# Patient Record
Sex: Female | Born: 1960 | Race: White | Hispanic: No | Smoking: Former smoker
Health system: Southern US, Community
[De-identification: ages and names within clinical notes are randomized; demographics above are authoritative.]

## PROBLEM LIST (undated history)

## (undated) DIAGNOSIS — Z91030 Bee allergy status: Secondary | ICD-10-CM

## (undated) DIAGNOSIS — M1990 Unspecified osteoarthritis, unspecified site: Secondary | ICD-10-CM

## (undated) DIAGNOSIS — F419 Anxiety disorder, unspecified: Secondary | ICD-10-CM

## (undated) DIAGNOSIS — J302 Other seasonal allergic rhinitis: Secondary | ICD-10-CM

## (undated) HISTORY — PX: JOINT REPLACEMENT: SHX530

## (undated) HISTORY — PX: FRACTURE SURGERY: SHX138

## (undated) HISTORY — PX: ANKLE HARDWARE REMOVAL: SHX1149

## (undated) HISTORY — DX: Anxiety disorder, unspecified: F41.9

## (undated) HISTORY — DX: Unspecified osteoarthritis, unspecified site: M19.90

---

## 1993-02-27 HISTORY — PX: ANKLE FRACTURE SURGERY: SHX122

## 2003-10-15 ENCOUNTER — Encounter: Admission: RE | Admit: 2003-10-15 | Discharge: 2003-10-15 | Payer: Self-pay | Admitting: Occupational Medicine

## 2006-04-28 HISTORY — PX: WRIST FRACTURE SURGERY: SHX121

## 2006-05-05 ENCOUNTER — Emergency Department (HOSPITAL_COMMUNITY): Admission: EM | Admit: 2006-05-05 | Discharge: 2006-05-05 | Payer: Self-pay | Admitting: Emergency Medicine

## 2006-05-05 ENCOUNTER — Observation Stay (HOSPITAL_COMMUNITY): Admission: EM | Admit: 2006-05-05 | Discharge: 2006-05-06 | Payer: Self-pay | Admitting: Orthopedic Surgery

## 2007-11-29 ENCOUNTER — Other Ambulatory Visit: Admission: RE | Admit: 2007-11-29 | Discharge: 2007-11-29 | Payer: Self-pay | Admitting: Family Medicine

## 2007-11-29 LAB — HM PAP SMEAR: HM Pap smear: NEGATIVE

## 2007-11-29 LAB — BASIC METABOLIC PANEL: Glucose: 76

## 2007-11-29 LAB — TSH: TSH: 0.99 (ref 0.41–5.90)

## 2007-12-17 LAB — HM DEXA SCAN

## 2007-12-18 LAB — LIPID PANEL
Cholesterol: 188 (ref 0–200)
HDL: 49 (ref 35–70)
LDL Cholesterol: 138
Triglycerides: 85 (ref 40–160)

## 2010-07-15 NOTE — Op Note (Signed)
NAMESARH, KIRSCHENBAUM          ACCOUNT NO.:  [id number]   MEDICAL RECORD NO.:  [id number]          PATIENT TYPE:  OBV   LOCATION:  5004                         FACILITY:  [facility]   PHYSICIAN:  Harvie Junior, M.D.   DATE OF BIRTH:  Aug 16, 1960   DATE OF PROCEDURE:  05/05/2006  DATE OF DISCHARGE:                               OPERATIVE REPORT   PREOPERATIVE DIAGNOSIS:  Fracture of distal radius with comminution of 3  or more pieces intraarticular.   POSTOPERATIVE DIAGNOSIS:  Fracture of distal radius with comminution of  3 or more pieces intraarticular.   PROCEDURE PERFORMED:  Open reduction and internal fixation of distal  radius fracture with a distal volar radius plate.   SURGEON:  Harvie Junior, M.D.   ASSISTANT:  Lindwood Qua, P.A.   ANESTHESIA:  General.   BRIEF HISTORY:  Ms. Koppel is 50 year old female with a long history  of having fallen at work.  She ultimately suffered a distal radius  fracture with intraarticular comminution.  I evaluated her in the  emergency room and we had a long talk about treatment options including  the possibility of a closed reduction versus open reduction.  I felt  given her young age, dominant side arm and intraarticular nature of the  fracture that open reduction and internal fixation was the most  appropriate course of action and this is ultimately what she elected to  do.  She was brought to the operating room for this procedure.   PROCEDURE:  The patient is brought to the operating room.  After  adequate anesthesia was obtained with general anesthetic, the patient  was placed up on the operating room table.  The right arm was prepped  and draped in the usual sterile fashion.  Following this, a small  incision was made over the distal radius.  Subcutaneous tissue was  dissected down to the level of the flexor carpi radialis tendon.  This  sheath was opened and the tendon was retracted radially and the floor  was exploited.  The muscles were then retracted down to the palmaris  quadratus which was identified and divided along its radial border.  The  fracture line was then identified and then held in an anatomically  reduced position.  There was some significant comminution dorsally but I  did not feel that this needed to be addressed.  Once the fracture was  anatomically reduced, a plate was provisionally fixed.  With the  provisional fixation, the plate was then placed where it needed to be  appropriately.  At that point, the gliding screw was placed and then the  plate was fixed in an anatomic position.  Once this was completed, the  distal pegs were all placed.  We placed 4 in the proximal row and 3 in  the distal row, had excellent fixation of the styloid as well as of the  plate itself.  Fluoro images were used, which showed that there was  anatomic alignment of the fracture as well as excellent fixation and  that we were not in the joint.  The proximal screws were then placed.  The  most distal screw interestingly was placed where there was some  dorsal comminution so we got 2 great screws and then 1 that was really  part of in the union, but it also seemed like it was wedged into that  dorsal piece and it actually got a good bite so we will have to keep an  eye on that and see.  The wound was then copiously irrigated.  Tourniquet was let down just because there was some question about what  was going on proximally.  The radial artery was intact, you could see it  pulsing at that point and the tourniquet was reinflated for closure.  The palmaris quadratus was reapproximated with 3-0 Vicryl interrupted  sutures and the skin was then closed with interrupted Vicryl and then 4-  0 nylon running  sutures.  Sterile compression dressing was applied as well as a volar  plaster.  The patient was taken to recovery, was noted to be in  satisfactory condition.   She will be admitted for overnight observation and  discharged in the  morning.      Harvie Junior, M.D.  Electronically Signed     JLG/MEDQ  D:  05/05/2006  T:  05/06/2006  Job:  [id number]

## 2016-03-01 DIAGNOSIS — L720 Epidermal cyst: Secondary | ICD-10-CM | POA: Diagnosis not present

## 2016-05-05 DIAGNOSIS — L723 Sebaceous cyst: Secondary | ICD-10-CM | POA: Diagnosis not present

## 2016-05-05 DIAGNOSIS — D485 Neoplasm of uncertain behavior of skin: Secondary | ICD-10-CM | POA: Diagnosis not present

## 2016-09-19 DIAGNOSIS — K080 Exfoliation of teeth due to systemic causes: Secondary | ICD-10-CM | POA: Diagnosis not present

## 2017-03-28 DIAGNOSIS — K080 Exfoliation of teeth due to systemic causes: Secondary | ICD-10-CM | POA: Diagnosis not present

## 2017-04-17 DIAGNOSIS — K080 Exfoliation of teeth due to systemic causes: Secondary | ICD-10-CM | POA: Diagnosis not present

## 2017-04-21 DIAGNOSIS — M25561 Pain in right knee: Secondary | ICD-10-CM | POA: Diagnosis not present

## 2017-04-21 DIAGNOSIS — M25512 Pain in left shoulder: Secondary | ICD-10-CM | POA: Diagnosis not present

## 2017-05-21 DIAGNOSIS — G5602 Carpal tunnel syndrome, left upper limb: Secondary | ICD-10-CM | POA: Diagnosis not present

## 2017-05-21 DIAGNOSIS — M1711 Unilateral primary osteoarthritis, right knee: Secondary | ICD-10-CM | POA: Diagnosis not present

## 2017-05-21 DIAGNOSIS — M1712 Unilateral primary osteoarthritis, left knee: Secondary | ICD-10-CM | POA: Diagnosis not present

## 2017-05-21 DIAGNOSIS — K080 Exfoliation of teeth due to systemic causes: Secondary | ICD-10-CM | POA: Diagnosis not present

## 2017-06-06 DIAGNOSIS — M1712 Unilateral primary osteoarthritis, left knee: Secondary | ICD-10-CM | POA: Diagnosis not present

## 2017-06-06 DIAGNOSIS — M1711 Unilateral primary osteoarthritis, right knee: Secondary | ICD-10-CM | POA: Diagnosis not present

## 2017-06-13 DIAGNOSIS — M1711 Unilateral primary osteoarthritis, right knee: Secondary | ICD-10-CM | POA: Diagnosis not present

## 2017-06-13 DIAGNOSIS — M1712 Unilateral primary osteoarthritis, left knee: Secondary | ICD-10-CM | POA: Diagnosis not present

## 2017-06-27 DIAGNOSIS — M1711 Unilateral primary osteoarthritis, right knee: Secondary | ICD-10-CM | POA: Diagnosis not present

## 2017-06-27 DIAGNOSIS — M1712 Unilateral primary osteoarthritis, left knee: Secondary | ICD-10-CM | POA: Diagnosis not present

## 2017-08-15 DIAGNOSIS — M1712 Unilateral primary osteoarthritis, left knee: Secondary | ICD-10-CM | POA: Diagnosis not present

## 2017-08-15 DIAGNOSIS — M1711 Unilateral primary osteoarthritis, right knee: Secondary | ICD-10-CM | POA: Diagnosis not present

## 2017-10-02 DIAGNOSIS — K080 Exfoliation of teeth due to systemic causes: Secondary | ICD-10-CM | POA: Diagnosis not present

## 2017-12-17 DIAGNOSIS — M170 Bilateral primary osteoarthritis of knee: Secondary | ICD-10-CM | POA: Diagnosis not present

## 2017-12-20 ENCOUNTER — Other Ambulatory Visit: Payer: Self-pay | Admitting: Orthopaedic Surgery

## 2017-12-21 ENCOUNTER — Encounter: Payer: Self-pay | Admitting: Family Medicine

## 2017-12-21 ENCOUNTER — Other Ambulatory Visit: Payer: Self-pay

## 2017-12-21 ENCOUNTER — Ambulatory Visit: Payer: Federal, State, Local not specified - PPO | Admitting: Family Medicine

## 2017-12-21 VITALS — BP 111/74 | HR 71 | Temp 98.3°F | Ht 67.0 in | Wt 233.6 lb

## 2017-12-21 DIAGNOSIS — M25562 Pain in left knee: Secondary | ICD-10-CM | POA: Diagnosis not present

## 2017-12-21 DIAGNOSIS — G8929 Other chronic pain: Secondary | ICD-10-CM

## 2017-12-21 DIAGNOSIS — Z01810 Encounter for preprocedural cardiovascular examination: Secondary | ICD-10-CM

## 2017-12-21 DIAGNOSIS — M1712 Unilateral primary osteoarthritis, left knee: Secondary | ICD-10-CM | POA: Diagnosis not present

## 2017-12-21 LAB — POCT CBC
Granulocyte percent: 57 %G (ref 37–80)
HCT, POC: 37.8 % (ref 29–41)
Hemoglobin: 12.6 g/dL (ref 9.5–13.5)
Lymph, poc: 2.5 (ref 0.6–3.4)
MCH, POC: 33.3 pg — AB (ref 27–31.2)
MCHC: 33.3 g/dL (ref 31.8–35.4)
MCV: 100 fL (ref 76–111)
MID (cbc): 0.3 (ref 0–0.9)
MPV: 7.2 fL (ref 0–99.8)
POC Granulocyte: 3.7 (ref 2–6.9)
POC LYMPH PERCENT: 38.1 %L (ref 10–50)
POC MID %: 4.9 %M (ref 0–12)
Platelet Count, POC: 367 10*3/uL (ref 142–424)
RBC: 3.77 M/uL — AB (ref 4.04–5.48)
RDW, POC: 13 %
WBC: 6.5 10*3/uL (ref 4.6–10.2)

## 2017-12-21 NOTE — Patient Instructions (Signed)
° ° ° °  If you have lab work done today you will be contacted with your lab results within the next 2 weeks.  If you have not heard from us then please contact us. The fastest way to get your results is to register for My Chart. ° ° °IF you received an x-ray today, you will receive an invoice from [facility]. Please contact [facility] at [phone] with questions or concerns regarding your invoice.  ° °IF you received labwork today, you will receive an invoice from [facility]. Please contact [facility] at [phone] with questions or concerns regarding your invoice.  ° °Our billing staff will not be able to assist you with questions regarding bills from these companies. ° °You will be contacted with the lab results as soon as they are available. The fastest way to get your results is to activate your My Chart account. Instructions are located on the last page of this paperwork. If you have not heard from us regarding the results in 2 weeks, please contact this office. °  ° ° ° °

## 2017-12-21 NOTE — Progress Notes (Signed)
10/25/20195:34 PM  Briana Lamb 10-Jul-1960, 57 y.o. female [id number]  Chief Complaint  Patient presents with  . Establish Care    having left knee replacement surgery in November if deemed clear from today visit    HPI:   Patient is a 57 y.o. female with past medical history significant for DJD left knee who presents today for preop clearance for Left TKA  Surgery scheduled for Nov 12th 2019 RCRI: Surgery risk: intermediate H/o ischemic heart disease: no H/o of heart failure: no  H/o CVA: no  DM on insulin: no Preoperative creatinine > 73m/dl: labs pending  Walks 2-3 miles a day Used to walk 8-10 miles a day  Denies any problems with anesthesia  Fall Risk  12/21/2017  Falls in the past year? No     Depression screen PHQ 2/9 12/21/2017  Decreased Interest 0  Down, Depressed, Hopeless 0  PHQ - 2 Score 0    Allergies  Allergen Reactions  . Penicillins Swelling    Prior to Admission medications   Not on File    Past Medical History:  Diagnosis Date  . Allergy   . Anxiety   . Arthritis     Past Surgical History:  Procedure Laterality Date  . ANKLE FRACTURE SURGERY    . CESAREAN SECTION    . WRIST SURGERY      Social History   Tobacco Use  . Smoking status: Never Smoker  . Smokeless tobacco: Never Used  Substance Use Topics  . Alcohol use: Never    Frequency: Never    Family History  Problem Relation Age of Onset  . Stroke Maternal Grandfather     Review of Systems  Constitutional: Negative for chills, fever and malaise/fatigue.  Respiratory: Negative for cough and shortness of breath.   Cardiovascular: Negative for chest pain, palpitations and leg swelling.  Gastrointestinal: Negative for abdominal pain, nausea and vomiting.  Genitourinary: Negative for dysuria, frequency, hematuria and urgency.  Musculoskeletal: Positive for joint pain.  Skin: Negative for rash.  Neurological: Negative for dizziness, tingling and focal  weakness.  Endo/Heme/Allergies: Negative for polydipsia.  All other systems reviewed and are negative.    OBJECTIVE:  Blood pressure 111/74, pulse 71, temperature 98.3 F (36.8 C), temperature source Oral, height 5' 7"  (1.702 m), weight 233 lb 9.6 oz (106 kg), SpO2 98 %. Body mass index is 36.59 kg/m.   Physical Exam  Constitutional: She is oriented to person, place, and time. She appears well-developed and well-nourished.  HENT:  Head: Normocephalic and atraumatic.  Right Ear: Hearing, tympanic membrane, external ear and ear canal normal.  Left Ear: Hearing, tympanic membrane, external ear and ear canal normal.  Mouth/Throat: Oropharynx is clear and moist.  Eyes: Pupils are equal, round, and reactive to light. Conjunctivae and EOM are normal.  Neck: Neck supple. No thyromegaly present.  Cardiovascular: Normal rate, regular rhythm, normal heart sounds and intact distal pulses. Exam reveals no gallop and no friction rub.  No murmur heard. Pulmonary/Chest: Effort normal and breath sounds normal. She has no wheezes. She has no rales.  Abdominal: Soft. Bowel sounds are normal. She exhibits no distension and no mass. There is no tenderness.  Musculoskeletal: Normal range of motion. She exhibits no edema.  Lymphadenopathy:    She has no cervical adenopathy.  Neurological: She is alert and oriented to person, place, and time. She has normal reflexes. No cranial nerve deficit. Gait normal.  Skin: Skin is warm and dry.  Psychiatric: She has  a normal mood and affect.  Nursing note and vitals reviewed.  My interpretation of EKG:  NSR, HR 66, no st changes, normal intervals  Results for orders placed or performed in visit on 12/21/17 (from the past 48 hour(s))  CMP14+EGFR     Status: Abnormal   Collection Time: 12/21/17  5:36 PM  Result Value Ref Range   Glucose 97 65 - 99 mg/dL   BUN 15 6 - 24 mg/dL   Creatinine, Ser 0.83 0.57 - 1.00 mg/dL   GFR calc non Af Amer 77 >59 mL/min/1.73    GFR calc Af Amer 89 >59 mL/min/1.73   BUN/Creatinine Ratio 18 9 - 23   Sodium 144 134 - 144 mmol/L   Potassium 4.0 3.5 - 5.2 mmol/L   Chloride 107 (H) 96 - 106 mmol/L   CO2 21 20 - 29 mmol/L   Calcium 9.3 8.7 - 10.2 mg/dL   Total Protein 6.3 6.0 - 8.5 g/dL   Albumin 4.3 3.5 - 5.5 g/dL   Globulin, Total 2.0 1.5 - 4.5 g/dL   Albumin/Globulin Ratio 2.2 1.2 - 2.2   Bilirubin Total 0.3 0.0 - 1.2 mg/dL   Alkaline Phosphatase 80 39 - 117 IU/L   AST 17 0 - 40 IU/L   ALT 10 0 - 32 IU/L  Protime-INR     Status: None   Collection Time: 12/21/17  5:36 PM  Result Value Ref Range   INR 0.9 0.8 - 1.2    Comment: Reference interval is for non-anticoagulated patients. Suggested INR therapeutic range for Vitamin K antagonist therapy:    Standard Dose (moderate intensity                   therapeutic range):       2.0 - 3.0    Higher intensity therapeutic range       2.5 - 3.5    Prothrombin Time 10.0 9.1 - 12.0 sec  POCT CBC     Status: Abnormal   Collection Time: 12/21/17  5:49 PM  Result Value Ref Range   WBC 6.5 4.6 - 10.2 K/uL   Lymph, poc 2.5 0.6 - 3.4   POC LYMPH PERCENT 38.1 10 - 50 %L   MID (cbc) 0.3 0 - 0.9   POC MID % 4.9 0 - 12 %M   POC Granulocyte 3.7 2 - 6.9   Granulocyte percent 57.0 37 - 80 %G   RBC 3.77 (A) 4.04 - 5.48 M/uL   Hemoglobin 12.6 9.5 - 13.5 g/dL   HCT, POC 37.8 29 - 41 %   MCV 100.0 76 - 111 fL   MCH, POC 33.3 (A) 27 - 31.2 pg   MCHC 33.3 31.8 - 35.4 g/dL   RDW, POC 13.0 %   Platelet Count, POC 367 142 - 424 K/uL   MPV 7.2 0 - 99.8 fL    ASSESSMENT and PLAN  1. Pre-operative cardiovascular examination No concerns per exam, labs or ekg. Patient is medically optimized for TKA. Per ACC/AHA no further cardiac testing required. - CMP14+EGFR - EKG 12-Lead - POCT CBC - Protime-INR - Urine Culture  2. Chronic pain of left knee  3. Primary osteoarthritis of left knee    Return in about 3 months (around 03/23/2018) for CPE.    Rutherford Guys,  MD Primary [facility] at [facility] [place], [place] [address] Ph.  [phone] Fax [phone]

## 2017-12-22 ENCOUNTER — Encounter: Payer: Self-pay | Admitting: Family Medicine

## 2017-12-22 LAB — CMP14+EGFR
ALT: 10 IU/L (ref 0–32)
AST: 17 IU/L (ref 0–40)
Albumin/Globulin Ratio: 2.2 (ref 1.2–2.2)
Albumin: 4.3 g/dL (ref 3.5–5.5)
Alkaline Phosphatase: 80 IU/L (ref 39–117)
BUN/Creatinine Ratio: 18 (ref 9–23)
BUN: 15 mg/dL (ref 6–24)
Bilirubin Total: 0.3 mg/dL (ref 0.0–1.2)
CO2: 21 mmol/L (ref 20–29)
Calcium: 9.3 mg/dL (ref 8.7–10.2)
Chloride: 107 mmol/L — ABNORMAL HIGH (ref 96–106)
Creatinine, Ser: 0.83 mg/dL (ref 0.57–1.00)
GFR calc Af Amer: 89 mL/min/{1.73_m2} (ref >59)
GFR calc non Af Amer: 77 mL/min/{1.73_m2} (ref >59)
Globulin, Total: 2 g/dL (ref 1.5–4.5)
Glucose: 97 mg/dL (ref 65–99)
Potassium: 4 mmol/L (ref 3.5–5.2)
Sodium: 144 mmol/L (ref 134–144)
Total Protein: 6.3 g/dL (ref 6.0–8.5)

## 2017-12-22 LAB — PROTIME-INR
INR: 0.9 (ref 0.8–1.2)
Prothrombin Time: 10 s (ref 9.1–12.0)

## 2017-12-22 LAB — URINE CULTURE

## 2017-12-24 ENCOUNTER — Telehealth: Payer: Self-pay

## 2017-12-24 NOTE — Telephone Encounter (Signed)
Doctor's notes faxed for pt knee replacement surgery

## 2017-12-28 ENCOUNTER — Encounter (HOSPITAL_COMMUNITY)
Admission: RE | Admit: 2017-12-28 | Discharge: 2017-12-28 | Disposition: A | Discharge status: Home / Self Care | Payer: Federal, State, Local not specified - PPO | Source: Ambulatory Visit | Attending: Orthopaedic Surgery | Admitting: Orthopaedic Surgery

## 2017-12-28 ENCOUNTER — Other Ambulatory Visit: Payer: Self-pay

## 2017-12-28 ENCOUNTER — Encounter (HOSPITAL_COMMUNITY): Payer: Self-pay

## 2017-12-28 ENCOUNTER — Ambulatory Visit (HOSPITAL_COMMUNITY)
Admission: RE | Admit: 2017-12-28 | Discharge: 2017-12-28 | Disposition: A | Discharge status: Home / Self Care | Payer: Federal, State, Local not specified - PPO | Source: Ambulatory Visit | Attending: Orthopaedic Surgery | Admitting: Orthopaedic Surgery

## 2017-12-28 DIAGNOSIS — Z01818 Encounter for other preprocedural examination: Secondary | ICD-10-CM

## 2017-12-28 LAB — CBC WITH DIFFERENTIAL/PLATELET
ABS IMMATURE GRANULOCYTES: 0.03 10*3/uL (ref 0.00–0.07)
BASOS PCT: 2 %
Basophils Absolute: 0.1 10*3/uL (ref 0.0–0.1)
Eosinophils Absolute: 0.2 10*3/uL (ref 0.0–0.5)
Eosinophils Relative: 3 %
HCT: 39.3 % (ref 36.0–46.0)
Hemoglobin: 12.6 g/dL (ref 12.0–15.0)
IMMATURE GRANULOCYTES: 0 %
Lymphocytes Relative: 39 %
Lymphs Abs: 2.6 10*3/uL (ref 0.7–4.0)
MCH: 32.4 pg (ref 26.0–34.0)
MCHC: 32.1 g/dL (ref 30.0–36.0)
MCV: 101 fL — ABNORMAL HIGH (ref 80.0–100.0)
Monocytes Absolute: 0.5 10*3/uL (ref 0.1–1.0)
Monocytes Relative: 8 %
NEUTROS ABS: 3.3 10*3/uL (ref 1.7–7.7)
NEUTROS PCT: 48 %
PLATELETS: 416 10*3/uL — AB (ref 150–400)
RBC: 3.89 MIL/uL (ref 3.87–5.11)
RDW: 12.6 % (ref 11.5–15.5)
WBC: 6.8 10*3/uL (ref 4.0–10.5)
nRBC: 0.3 % — ABNORMAL HIGH (ref 0.0–0.2)

## 2017-12-28 LAB — URINALYSIS, ROUTINE W REFLEX MICROSCOPIC
BILIRUBIN URINE: NEGATIVE
Glucose, UA: NEGATIVE mg/dL
Hgb urine dipstick: NEGATIVE
Ketones, ur: NEGATIVE mg/dL
Leukocytes, UA: NEGATIVE
NITRITE: NEGATIVE
PH: 6 (ref 5.0–8.0)
Protein, ur: NEGATIVE mg/dL
SPECIFIC GRAVITY, URINE: 1.006 (ref 1.005–1.030)

## 2017-12-28 LAB — TYPE AND SCREEN
ABO/RH(D): O POS
ANTIBODY SCREEN: NEGATIVE

## 2017-12-28 LAB — BASIC METABOLIC PANEL
ANION GAP: 5 (ref 5–15)
BUN: 6 mg/dL (ref 6–20)
CO2: 27 mmol/L (ref 22–32)
Calcium: 9.2 mg/dL (ref 8.9–10.3)
Chloride: 109 mmol/L (ref 98–111)
Creatinine, Ser: 0.78 mg/dL (ref 0.44–1.00)
GFR calc Af Amer: >60 mL/min (ref >60)
Glucose, Bld: 84 mg/dL (ref 70–99)
POTASSIUM: 3.8 mmol/L (ref 3.5–5.1)
SODIUM: 141 mmol/L (ref 135–145)

## 2017-12-28 LAB — SURGICAL PCR SCREEN
MRSA, PCR: NEGATIVE
Staphylococcus aureus: POSITIVE — AB

## 2017-12-28 LAB — APTT: APTT: 28 s (ref 24–36)

## 2017-12-28 LAB — PROTIME-INR
INR: 0.95
Prothrombin Time: 12.6 seconds (ref 11.4–15.2)

## 2017-12-28 LAB — ABO/RH: ABO/RH(D): O POS

## 2017-12-28 NOTE — Progress Notes (Addendum)
PCP: Koren Shiver, MD  Cardiologist:pt denies  EKG: 12/21/17 in [facility]  Stress test: pt denies  ECHO: pt denies  Cardiac Cath: pt denies  Chest x-ray: 12/28/17 in [facility]

## 2017-12-28 NOTE — Pre-Procedure Instructions (Signed)
Kathalene A Dicenzo  12/28/2017      [id number] [address] [place] [address] Phone: [phone] Fax: [phone]    Your procedure is scheduled on January 08, 2018.  Report to [facility] Admitting at 530 AM.  Call this number if you have problems the morning of surgery:  [phone]   Remember:  Do not eat or drink after midnight.    Take these medicines the morning of surgery with A SIP OF WATER -none  7 days prior to surgery (01/01/18) STOP taking any Aspirin (unless otherwise instructed by your surgeon), Aleve, Naproxen, Ibuprofen, Motrin, Advil, Goody's, BC's, all herbal medications, fish oil, and all vitamins    Do not wear jewelry, make-up or nail polish.  Do not wear lotions, powders, or perfumes, or deodorant.  Do not shave 48 hours prior to surgery.    Do not bring valuables to the hospital.  [facility] is not responsible for any belongings or valuables.  Contacts, dentures or bridgework may not be worn into surgery.  Leave your suitcase in the car.  After surgery it may be brought to your room.  For patients admitted to the hospital, discharge time will be determined by your treatment team.  Patients discharged the day of surgery will not be allowed to drive home.    [place]- Preparing For Surgery  Before surgery, you can play an important role. Because skin is not sterile, your skin needs to be as free of germs as possible. You can reduce the number of germs on your skin by washing with CHG (chlorahexidine gluconate) Soap before surgery.  CHG is an antiseptic cleaner which kills germs and bonds with the skin to continue killing germs even after washing.    Oral Hygiene is also important to reduce your risk of infection.  Remember - BRUSH YOUR TEETH THE MORNING OF SURGERY WITH YOUR REGULAR TOOTHPASTE  Please do not use if you have an allergy to CHG or antibacterial soaps. If your skin  becomes reddened/irritated stop using the CHG.  Do not shave (including legs and underarms) for at least 48 hours prior to first CHG shower. It is OK to shave your face.  Please follow these instructions carefully.   1. Shower the NIGHT BEFORE SURGERY and the MORNING OF SURGERY with CHG.   2. If you chose to wash your hair, wash your hair first as usual with your normal shampoo.  3. After you shampoo, rinse your hair and body thoroughly to remove the shampoo.  4. Use CHG as you would any other liquid soap. You can apply CHG directly to the skin and wash gently with a scrungie or a clean washcloth.   5. Apply the CHG Soap to your body ONLY FROM THE NECK DOWN.  Do not use on open wounds or open sores. Avoid contact with your eyes, ears, mouth and genitals (private parts). Wash Face and genitals (private parts)  with your normal soap.  6. Wash thoroughly, paying special attention to the area where your surgery will be performed.  7. Thoroughly rinse your body with warm water from the neck down.  8. DO NOT shower/wash with your normal soap after using and rinsing off the CHG Soap.  9. Pat yourself dry with a CLEAN TOWEL.  10. Wear CLEAN PAJAMAS to bed the night before surgery, wear comfortable clothes the morning of surgery  11. Place CLEAN SHEETS on your bed the  night of your first shower and DO NOT SLEEP WITH PETS.  Day of Surgery: Shower as above Do not apply any deodorants/lotions.  Please wear clean clothes to the hospital/surgery center.   Remember to brush your teeth WITH YOUR REGULAR TOOTHPASTE.  Please read over the following fact sheets that you were given.

## 2017-12-28 NOTE — Progress Notes (Signed)
[facility] [address], [place].

## 2018-01-02 NOTE — H&P (Signed)
TOTAL KNEE ADMISSION H&P  Patient is being admitted for left total knee arthroplasty.  Subjective:  Chief Complaint:left knee pain.  HPI: Briana Lamb, 57 y.o. female, has a history of pain and functional disability in the left knee due to arthritis and has failed non-surgical conservative treatments for greater than 12 weeks to includeNSAID's and/or analgesics, corticosteriod injections, viscosupplementation injections, flexibility and strengthening excercises, use of assistive devices, weight reduction as appropriate and activity modification.  Onset of symptoms was gradual, starting 5 years ago with gradually worsening course since that time. The patient noted no past surgery on the left knee(s).  Patient currently rates pain in the left knee(s) at 10 out of 10 with activity. Patient has night pain, worsening of pain with activity and weight bearing, pain that interferes with activities of daily living, crepitus and joint swelling.  Patient has evidence of subchondral cysts, subchondral sclerosis, periarticular osteophytes and joint space narrowing by imaging studies. There is no active infection.  There are no active problems to display for this patient.  Past Medical History:  Diagnosis Date  . Allergy   . Anxiety   . Arthritis     Past Surgical History:  Procedure Laterality Date  . ANKLE FRACTURE SURGERY    . CESAREAN SECTION    . WRIST SURGERY      No current facility-administered medications for this encounter.    Current Outpatient Medications  Medication Sig Dispense Refill Last Dose  . Ascorbic Acid (VITAMIN C) 1000 MG tablet Take 1,000 mg by mouth daily.     . Aspirin-Salicylamide-Caffeine (BC FAST PAIN RELIEF) 650-195-33.3 MG PACK Take 1 Package by mouth daily as needed (pain).     Marland Kitchen b complex vitamins tablet Take 1 tablet by mouth daily.     . Echinacea 125 MG CAPS Take 1 capsule by mouth daily as needed (colder months).     . Multiple Vitamins-Minerals  (MULTIVITAMIN WITH MINERALS) tablet Take 1 tablet by mouth daily.      Allergies  Allergen Reactions  . Bee Venom Swelling    At site   . Penicillins Swelling  . Penicillins Swelling    Has patient had a PCN reaction causing immediate rash, facial/tongue/throat swelling, SOB or lightheadedness with hypotension: unkn Has patient had a PCN reaction causing severe rash involving mucus membranes or skin necrosis: unkn Has patient had a PCN reaction that required hospitalization: unkn Has patient had a PCN reaction occurring within the last 10 years: unkn If all of the above answers are "NO", then may proceed with Cephalosporin use.     Social History   Tobacco Use  . Smoking status: Never Smoker  . Smokeless tobacco: Never Used  Substance Use Topics  . Alcohol use: Never    Frequency: Never    Family History  Problem Relation Age of Onset  . Stroke Maternal Grandfather      Review of Systems  Musculoskeletal: Positive for joint pain.       Left knee   All other systems reviewed and are negative.   Objective:  Physical Exam  Constitutional: She is oriented to person, place, and time. She appears well-developed and well-nourished.  HENT:  Head: Normocephalic and atraumatic.  Eyes: Pupils are equal, round, and reactive to light.  Neck: Normal range of motion.  Cardiovascular: Normal rate and regular rhythm.  Respiratory: Effort normal.  GI: Soft.  Musculoskeletal:  Left knee range of motion is 0-85.  There is no effusion today.  She has  crepitation and some medial joint line pain.  Hip motion is full and straight leg raise is negative.  Sensation and motor function are intact distally.  She has palpable pulses in her feet and intact sensation and motor function on both sides.   Neurological: She is alert and oriented to person, place, and time.  Skin: Skin is warm and dry.  Psychiatric: She has a normal mood and affect. Her behavior is normal. Judgment and thought content  normal.    Vital signs in last 24 hours:    Labs:   Estimated body mass index is 38.06 kg/m as calculated from the following:   Height as of 12/28/17: 5\' 6"  (1.676 m).   Weight as of 12/28/17: 107 kg.   Imaging Review Plain radiographs demonstrate severe degenerative joint disease of the left knee(s). The overall alignment isneutral. The bone quality appears to be good for age and reported activity level.   Preoperative templating of the joint replacement has been completed, documented, and submitted to the Operating Room personnel in order to optimize intra-operative equipment management.    Patient's anticipated LOS is less than 2 midnights, meeting these requirements: - Younger than 3 - Lives within 1 hour of care - Has a competent adult at home to recover with post-op recover - NO history of  - Chronic pain requiring opiods  - Diabetes  - Coronary Artery Disease  - Heart failure  - Heart attack  - Stroke  - DVT/VTE  - Cardiac arrhythmia  - Respiratory Failure/COPD  - Renal failure  - Anemia  - Advanced Liver disease  Assessment/Plan:  End stage primary arthritis, left knee   The patient history, physical examination, clinical judgment of the provider and imaging studies are consistent with end stage degenerative joint disease of the left knee(s) and total knee arthroplasty is deemed medically necessary. The treatment options including medical management, injection therapy arthroscopy and arthroplasty were discussed at length. The risks and benefits of total knee arthroplasty were presented and reviewed. The risks due to aseptic loosening, infection, stiffness, patella tracking problems, thromboembolic complications and other imponderables were discussed. The patient acknowledged the explanation, agreed to proceed with the plan and consent was signed. Patient is being admitted for inpatient treatment for surgery, pain control, PT, OT, prophylactic antibiotics, VTE  prophylaxis, progressive ambulation and ADL's and discharge planning. The patient is planning to be discharged home with home health services

## 2018-01-07 MED ORDER — BUPIVACAINE LIPOSOME 1.3 % IJ SUSP
20.0000 mL | INTRAMUSCULAR | Status: AC
  Administered 2018-01-08: 20 mL
  Filled 2018-01-07: qty 20

## 2018-01-07 MED ORDER — TRANEXAMIC ACID 1000 MG/10ML IV SOLN
2000.0000 mg | INTRAVENOUS | Status: AC
  Administered 2018-01-08: 2000 mg via TOPICAL
  Filled 2018-01-07: qty 20

## 2018-01-07 MED ORDER — TRANEXAMIC ACID-NACL 1000-0.7 MG/100ML-% IV SOLN
1000.0000 mg | INTRAVENOUS | Status: AC
  Administered 2018-01-08: 1000 mg via INTRAVENOUS
  Filled 2018-01-07: qty 100

## 2018-01-08 ENCOUNTER — Encounter (HOSPITAL_COMMUNITY): Payer: Self-pay

## 2018-01-08 ENCOUNTER — Observation Stay (HOSPITAL_COMMUNITY)
Admission: RE | Admit: 2018-01-08 | Discharge: 2018-01-09 | Disposition: A | Discharge status: Home Health | Payer: Federal, State, Local not specified - PPO | Source: Ambulatory Visit | Attending: Orthopaedic Surgery | Admitting: Orthopaedic Surgery

## 2018-01-08 ENCOUNTER — Ambulatory Visit (HOSPITAL_COMMUNITY): Payer: Federal, State, Local not specified - PPO | Admitting: Certified Registered"

## 2018-01-08 ENCOUNTER — Encounter (HOSPITAL_COMMUNITY)
Admission: RE | Disposition: A | Discharge status: Home Health | Payer: Self-pay | Source: Ambulatory Visit | Attending: Orthopaedic Surgery

## 2018-01-08 ENCOUNTER — Other Ambulatory Visit: Payer: Self-pay

## 2018-01-08 DIAGNOSIS — Z880 Allergy status to penicillin: Secondary | ICD-10-CM | POA: Insufficient documentation

## 2018-01-08 DIAGNOSIS — M1712 Unilateral primary osteoarthritis, left knee: Secondary | ICD-10-CM | POA: Diagnosis not present

## 2018-01-08 DIAGNOSIS — G8918 Other acute postprocedural pain: Secondary | ICD-10-CM | POA: Diagnosis not present

## 2018-01-08 DIAGNOSIS — Z79899 Other long term (current) drug therapy: Secondary | ICD-10-CM | POA: Diagnosis not present

## 2018-01-08 HISTORY — DX: Bee allergy status: Z91.030

## 2018-01-08 HISTORY — PX: TOTAL KNEE ARTHROPLASTY: SHX125

## 2018-01-08 HISTORY — DX: Other seasonal allergic rhinitis: J30.2

## 2018-01-08 SURGERY — ARTHROPLASTY, KNEE, TOTAL
Anesthesia: Spinal | Laterality: Left

## 2018-01-08 MED ORDER — METOCLOPRAMIDE HCL 5 MG PO TABS: 5.0000 mg | ORAL_TABLET | Freq: Three times a day (TID) | ORAL | Status: DC | PRN

## 2018-01-08 MED ORDER — PROMETHAZINE HCL 25 MG/ML IJ SOLN: 6.2500 mg | INTRAMUSCULAR | Status: DC | PRN

## 2018-01-08 MED ORDER — METHOCARBAMOL 1000 MG/10ML IJ SOLN
500.0000 mg | Freq: Four times a day (QID) | INTRAVENOUS | Status: DC | PRN
  Filled 2018-01-08: qty 5

## 2018-01-08 MED ORDER — CEFAZOLIN SODIUM-DEXTROSE 2-4 GM/100ML-% IV SOLN
2.0000 g | Freq: Four times a day (QID) | INTRAVENOUS | Status: AC
  Administered 2018-01-08 (×2): 2 g via INTRAVENOUS
  Filled 2018-01-08 (×2): qty 100

## 2018-01-08 MED ORDER — PHENYLEPHRINE 40 MCG/ML (10ML) SYRINGE FOR IV PUSH (FOR BLOOD PRESSURE SUPPORT)
PREFILLED_SYRINGE | INTRAVENOUS | Status: DC | PRN
  Administered 2018-01-08: 80 ug via INTRAVENOUS

## 2018-01-08 MED ORDER — ROPIVACAINE HCL 5 MG/ML IJ SOLN
INTRAMUSCULAR | Status: DC | PRN
  Administered 2018-01-08: 20 mL via PERINEURAL

## 2018-01-08 MED ORDER — DEXAMETHASONE SODIUM PHOSPHATE 10 MG/ML IJ SOLN
INTRAMUSCULAR | Status: DC | PRN
  Administered 2018-01-08: 10 mg via INTRAVENOUS

## 2018-01-08 MED ORDER — PROPOFOL 10 MG/ML IV BOLUS
INTRAVENOUS | Status: AC
  Filled 2018-01-08: qty 20

## 2018-01-08 MED ORDER — HYDROCODONE-ACETAMINOPHEN 5-325 MG PO TABS
1.0000 | ORAL_TABLET | ORAL | Status: DC | PRN
  Administered 2018-01-09: 2 via ORAL
  Filled 2018-01-08: qty 2

## 2018-01-08 MED ORDER — MEPERIDINE HCL 50 MG/ML IJ SOLN: 6.2500 mg | INTRAMUSCULAR | Status: DC | PRN

## 2018-01-08 MED ORDER — PHENYLEPHRINE 40 MCG/ML (10ML) SYRINGE FOR IV PUSH (FOR BLOOD PRESSURE SUPPORT)
PREFILLED_SYRINGE | INTRAVENOUS | Status: AC
  Filled 2018-01-08: qty 10

## 2018-01-08 MED ORDER — ONDANSETRON HCL 4 MG/2ML IJ SOLN
INTRAMUSCULAR | Status: DC | PRN
  Administered 2018-01-08: 4 mg via INTRAVENOUS

## 2018-01-08 MED ORDER — DEXAMETHASONE SODIUM PHOSPHATE 10 MG/ML IJ SOLN
INTRAMUSCULAR | Status: AC
  Filled 2018-01-08: qty 1

## 2018-01-08 MED ORDER — BISACODYL 5 MG PO TBEC: 5.0000 mg | DELAYED_RELEASE_TABLET | Freq: Every day | ORAL | Status: DC | PRN

## 2018-01-08 MED ORDER — METOCLOPRAMIDE HCL 5 MG/ML IJ SOLN: 5.0000 mg | Freq: Three times a day (TID) | INTRAMUSCULAR | Status: DC | PRN

## 2018-01-08 MED ORDER — PROPOFOL 10 MG/ML IV BOLUS
INTRAVENOUS | Status: DC | PRN
  Administered 2018-01-08: 20 mg via INTRAVENOUS

## 2018-01-08 MED ORDER — KETOROLAC TROMETHAMINE 15 MG/ML IJ SOLN
15.0000 mg | Freq: Four times a day (QID) | INTRAMUSCULAR | Status: AC
  Administered 2018-01-08 – 2018-01-09 (×4): 15 mg via INTRAVENOUS
  Filled 2018-01-08 (×4): qty 1

## 2018-01-08 MED ORDER — FENTANYL CITRATE (PF) 250 MCG/5ML IJ SOLN
INTRAMUSCULAR | Status: AC
  Filled 2018-01-08: qty 5

## 2018-01-08 MED ORDER — PHENOL 1.4 % MT LIQD: 1.0000 | OROMUCOSAL | Status: DC | PRN

## 2018-01-08 MED ORDER — 0.9 % SODIUM CHLORIDE (POUR BTL) OPTIME
TOPICAL | Status: DC | PRN
  Administered 2018-01-08: 1000 mL

## 2018-01-08 MED ORDER — BUPIVACAINE-EPINEPHRINE (PF) 0.25% -1:200000 IJ SOLN
INTRAMUSCULAR | Status: DC | PRN
  Administered 2018-01-08: 30 mL

## 2018-01-08 MED ORDER — HYDROCODONE-ACETAMINOPHEN 7.5-325 MG PO TABS
1.0000 | ORAL_TABLET | ORAL | Status: DC | PRN
  Administered 2018-01-08: 1 via ORAL
  Administered 2018-01-08: 2 via ORAL
  Filled 2018-01-08: qty 2
  Filled 2018-01-08: qty 1

## 2018-01-08 MED ORDER — MIDAZOLAM HCL 2 MG/2ML IJ SOLN
INTRAMUSCULAR | Status: AC
  Filled 2018-01-08: qty 2

## 2018-01-08 MED ORDER — PROPOFOL 500 MG/50ML IV EMUL
INTRAVENOUS | Status: DC | PRN
  Administered 2018-01-08: 75 ug/kg/min via INTRAVENOUS

## 2018-01-08 MED ORDER — DIPHENHYDRAMINE HCL 12.5 MG/5ML PO ELIX: 12.5000 mg | ORAL_SOLUTION | ORAL | Status: DC | PRN

## 2018-01-08 MED ORDER — BUPIVACAINE-EPINEPHRINE (PF) 0.25% -1:200000 IJ SOLN
INTRAMUSCULAR | Status: AC
  Filled 2018-01-08: qty 30

## 2018-01-08 MED ORDER — ONDANSETRON HCL 4 MG PO TABS: 4.0000 mg | ORAL_TABLET | Freq: Four times a day (QID) | ORAL | Status: DC | PRN

## 2018-01-08 MED ORDER — ALUM & MAG HYDROXIDE-SIMETH 200-200-20 MG/5ML PO SUSP: 30.0000 mL | ORAL | Status: DC | PRN

## 2018-01-08 MED ORDER — ACETAMINOPHEN 500 MG PO TABS
500.0000 mg | ORAL_TABLET | Freq: Four times a day (QID) | ORAL | Status: AC
  Administered 2018-01-08 – 2018-01-09 (×4): 500 mg via ORAL
  Filled 2018-01-08 (×4): qty 1

## 2018-01-08 MED ORDER — SODIUM CHLORIDE 0.9 % IV SOLN
INTRAVENOUS | Status: DC | PRN
  Administered 2018-01-08: 30 ug/min via INTRAVENOUS

## 2018-01-08 MED ORDER — SODIUM CHLORIDE 0.9 % IR SOLN
Status: DC | PRN
  Administered 2018-01-08: 3000 mL

## 2018-01-08 MED ORDER — LACTATED RINGERS IV SOLN
INTRAVENOUS | Status: DC
  Administered 2018-01-08 (×2): via INTRAVENOUS

## 2018-01-08 MED ORDER — ONDANSETRON HCL 4 MG/2ML IJ SOLN
INTRAMUSCULAR | Status: AC
  Filled 2018-01-08: qty 2

## 2018-01-08 MED ORDER — DOCUSATE SODIUM 100 MG PO CAPS
100.0000 mg | ORAL_CAPSULE | Freq: Two times a day (BID) | ORAL | Status: DC
  Administered 2018-01-08 – 2018-01-09 (×3): 100 mg via ORAL
  Filled 2018-01-08 (×3): qty 1

## 2018-01-08 MED ORDER — CEFAZOLIN SODIUM-DEXTROSE 2-4 GM/100ML-% IV SOLN
2.0000 g | INTRAVENOUS | Status: AC
  Administered 2018-01-08: 2 g via INTRAVENOUS
  Filled 2018-01-08: qty 100

## 2018-01-08 MED ORDER — ONDANSETRON HCL 4 MG/2ML IJ SOLN: 4.0000 mg | Freq: Four times a day (QID) | INTRAMUSCULAR | Status: DC | PRN

## 2018-01-08 MED ORDER — LACTATED RINGERS IV SOLN: INTRAVENOUS | Status: DC

## 2018-01-08 MED ORDER — CHLORHEXIDINE GLUCONATE 4 % EX LIQD: 60.0000 mL | Freq: Once | CUTANEOUS | Status: DC

## 2018-01-08 MED ORDER — FENTANYL CITRATE (PF) 250 MCG/5ML IJ SOLN
INTRAMUSCULAR | Status: DC | PRN
  Administered 2018-01-08: 100 ug via INTRAVENOUS

## 2018-01-08 MED ORDER — BUPIVACAINE IN DEXTROSE 0.75-8.25 % IT SOLN
INTRATHECAL | Status: DC | PRN
  Administered 2018-01-08: 1.8 mL via INTRATHECAL

## 2018-01-08 MED ORDER — LACTATED RINGERS IV SOLN
INTRAVENOUS | Status: DC
  Administered 2018-01-08: 20:00:00 via INTRAVENOUS
  Administered 2018-01-08: 50 mL via INTRAVENOUS

## 2018-01-08 MED ORDER — SODIUM CHLORIDE 0.9% FLUSH
INTRAVENOUS | Status: DC | PRN
  Administered 2018-01-08: 30 mL

## 2018-01-08 MED ORDER — ASPIRIN EC 325 MG PO TBEC
325.0000 mg | DELAYED_RELEASE_TABLET | Freq: Two times a day (BID) | ORAL | Status: DC
  Administered 2018-01-09: 325 mg via ORAL
  Filled 2018-01-08: qty 1

## 2018-01-08 MED ORDER — TRANEXAMIC ACID-NACL 1000-0.7 MG/100ML-% IV SOLN
1000.0000 mg | Freq: Once | INTRAVENOUS | Status: AC
  Administered 2018-01-08: 1000 mg via INTRAVENOUS
  Filled 2018-01-08: qty 100

## 2018-01-08 MED ORDER — PROPOFOL 1000 MG/100ML IV EMUL
INTRAVENOUS | Status: AC
  Filled 2018-01-08: qty 100

## 2018-01-08 MED ORDER — MENTHOL 3 MG MT LOZG: 1.0000 | LOZENGE | OROMUCOSAL | Status: DC | PRN

## 2018-01-08 MED ORDER — MORPHINE SULFATE (PF) 2 MG/ML IV SOLN: 0.5000 mg | INTRAVENOUS | Status: DC | PRN

## 2018-01-08 MED ORDER — ACETAMINOPHEN 325 MG PO TABS: 325.0000 mg | ORAL_TABLET | Freq: Four times a day (QID) | ORAL | Status: DC | PRN

## 2018-01-08 MED ORDER — MIDAZOLAM HCL 2 MG/2ML IJ SOLN
INTRAMUSCULAR | Status: DC | PRN
  Administered 2018-01-08: 2 mg via INTRAVENOUS

## 2018-01-08 MED ORDER — METHOCARBAMOL 500 MG PO TABS: 500.0000 mg | ORAL_TABLET | Freq: Four times a day (QID) | ORAL | Status: DC | PRN

## 2018-01-08 MED ORDER — HYDROMORPHONE HCL 1 MG/ML IJ SOLN: 0.2500 mg | INTRAMUSCULAR | Status: DC | PRN

## 2018-01-08 SURGICAL SUPPLY — 58 items
ATTUNE MED DOME PAT 41 KNEE (Knees) ×1 IMPLANT
ATTUNE PS FEM LT SZ 7 CEM KNEE (Femur) ×1 IMPLANT
BAG DECANTER FOR FLEXI CONT (MISCELLANEOUS) ×1 IMPLANT
BANDAGE ESMARK 6X9 LF (GAUZE/BANDAGES/DRESSINGS) ×1 IMPLANT
BASE TIBIA ATTUNE KNEE SYS SZ6 (Knees) IMPLANT
BLADE SAGITTAL 25.0X1.19X90 (BLADE) ×2 IMPLANT
BLADE SAW SGTL 13.0X1.19X90.0M (BLADE) IMPLANT
BNDG CMPR 9X6 STRL LF SNTH (GAUZE/BANDAGES/DRESSINGS) ×1
BNDG CMPR MED 10X6 ELC LF (GAUZE/BANDAGES/DRESSINGS) ×1
BNDG ELASTIC 6X10 VLCR STRL LF (GAUZE/BANDAGES/DRESSINGS) ×2 IMPLANT
BNDG ESMARK 6X9 LF (GAUZE/BANDAGES/DRESSINGS) ×2
BOWL SMART MIX CTS (DISPOSABLE) ×2 IMPLANT
BSPLAT TIB 6 CMNT ROT PLAT STR (Knees) ×1 IMPLANT
CEMENT HV SMART SET (Cement) ×4 IMPLANT
COVER SURGICAL LIGHT HANDLE (MISCELLANEOUS) ×2 IMPLANT
COVER WAND RF STERILE (DRAPES) ×2 IMPLANT
CUFF TOURNIQUET SINGLE 34IN LL (TOURNIQUET CUFF) ×2 IMPLANT
CUFF TOURNIQUET SINGLE 44IN (TOURNIQUET CUFF) IMPLANT
DECANTER SPIKE VIAL GLASS SM (MISCELLANEOUS) ×2 IMPLANT
DRAPE EXTREMITY T 121X128X90 (DRAPE) ×2 IMPLANT
DRAPE HALF SHEET 40X57 (DRAPES) ×4 IMPLANT
DRAPE U-SHAPE 47X51 STRL (DRAPES) ×2 IMPLANT
DRSG AQUACEL AG ADV 3.5X10 (GAUZE/BANDAGES/DRESSINGS) ×2 IMPLANT
DURAPREP 26ML APPLICATOR (WOUND CARE) ×4 IMPLANT
ELECT REM PT RETURN 9FT ADLT (ELECTROSURGICAL) ×2
ELECTRODE REM PT RTRN 9FT ADLT (ELECTROSURGICAL) ×1 IMPLANT
GLOVE BIO SURGEON STRL SZ8 (GLOVE) ×4 IMPLANT
GLOVE BIOGEL PI IND STRL 8 (GLOVE) ×2 IMPLANT
GLOVE BIOGEL PI INDICATOR 8 (GLOVE) ×2
GOWN STRL REUS W/ TWL LRG LVL3 (GOWN DISPOSABLE) ×1 IMPLANT
GOWN STRL REUS W/ TWL XL LVL3 (GOWN DISPOSABLE) ×2 IMPLANT
GOWN STRL REUS W/TWL LRG LVL3 (GOWN DISPOSABLE) ×2
GOWN STRL REUS W/TWL XL LVL3 (GOWN DISPOSABLE) ×4
HANDPIECE INTERPULSE COAX TIP (DISPOSABLE) ×2
HOOD PEEL AWAY FACE SHEILD DIS (HOOD) ×4 IMPLANT
INSERT ATTU PSRP SZ7 14MM KNEE (Insert) ×1 IMPLANT
KIT BASIN OR (CUSTOM PROCEDURE TRAY) ×2 IMPLANT
KIT TURNOVER KIT B (KITS) ×2 IMPLANT
MANIFOLD NEPTUNE II (INSTRUMENTS) ×2 IMPLANT
NDL HYPO 21X1 ECLIPSE (NEEDLE) ×1 IMPLANT
NDL SUT 6 .5 CRC .975X.05 MAYO (NEEDLE) IMPLANT
NEEDLE HYPO 21X1 ECLIPSE (NEEDLE) ×2 IMPLANT
NEEDLE MAYO TAPER (NEEDLE) ×2
NS IRRIG 1000ML POUR BTL (IV SOLUTION) ×2 IMPLANT
PACK TOTAL JOINT (CUSTOM PROCEDURE TRAY) ×2 IMPLANT
PAD ARMBOARD 7.5X6 YLW CONV (MISCELLANEOUS) ×4 IMPLANT
SET HNDPC FAN SPRY TIP SCT (DISPOSABLE) ×1 IMPLANT
SUT VIC AB 0 CT1 27 (SUTURE) ×2
SUT VIC AB 0 CT1 27XBRD ANBCTR (SUTURE) ×1 IMPLANT
SUT VIC AB 2-0 CT1 27 (SUTURE) ×2
SUT VIC AB 2-0 CT1 TAPERPNT 27 (SUTURE) ×1 IMPLANT
SUT VIC AB 3-0 FS2 27 (SUTURE) ×2 IMPLANT
SUT VLOC 180 0 24IN GS25 (SUTURE) ×2 IMPLANT
SYR 50ML LL SCALE MARK (SYRINGE) ×2 IMPLANT
TIBIA ATTUNE KNEE SYS BASE SZ6 (Knees) ×2 IMPLANT
TOWEL OR 17X24 6PK STRL BLUE (TOWEL DISPOSABLE) ×2 IMPLANT
TOWEL OR 17X26 10 PK STRL BLUE (TOWEL DISPOSABLE) ×2 IMPLANT
TRAY CATH 16FR W/PLASTIC CATH (SET/KITS/TRAYS/PACK) IMPLANT

## 2018-01-08 NOTE — Anesthesia Postprocedure Evaluation (Signed)
Anesthesia Post Note  Patient: Briana Lamb  Procedure(s) Performed: TOTAL KNEE ARTHROPLASTY (Left )     Patient location during evaluation: PACU Anesthesia Type: Spinal Level of consciousness: oriented and awake and alert Pain management: pain level controlled Vital Signs Assessment: post-procedure vital signs reviewed and stable Respiratory status: spontaneous breathing, respiratory function stable and patient connected to nasal cannula oxygen Cardiovascular status: blood pressure returned to baseline and stable Postop Assessment: no headache, no backache and no apparent nausea or vomiting Anesthetic complications: no    Last Vitals:  Vitals:   01/08/18 1153 01/08/18 1218  BP:  125/62  Pulse:  75  Resp:  15  Temp: (!) 36.2 C   SpO2:  96%    Last Pain:  Vitals:   01/08/18 1153  PainSc: 0-No pain                 Shelton SilvasKevin D Lynetta Tomczak

## 2018-01-08 NOTE — Anesthesia Procedure Notes (Signed)
Procedure Name: MAC Date/Time: 01/08/2018 7:45 AM Performed by: Barrington Ellison, CRNA Pre-anesthesia Checklist: Patient identified, Emergency Drugs available, Suction available, Patient being monitored and Timeout performed Patient Re-evaluated:Patient Re-evaluated prior to induction Oxygen Delivery Method: Simple face mask

## 2018-01-08 NOTE — Anesthesia Procedure Notes (Signed)
Spinal  Start time: 01/08/2018 7:43 AM End time: 01/08/2018 7:47 AM Staffing Anesthesiologist: Shelton SilvasHollis, Ezariah Nace D, MD Performed: anesthesiologist  Preanesthetic Checklist Completed: patient identified, site marked, surgical consent, pre-op evaluation, timeout performed, IV checked, risks and benefits discussed and monitors and equipment checked Spinal Block Patient position: sitting Prep: site prepped and draped and DuraPrep Location: L3-4 Injection technique: single-shot Needle Needle type: Pencan  Needle gauge: 24 G Needle length: 10 cm Needle insertion depth: 10 cm Additional Notes Patient tolerated well. No immediate complications.

## 2018-01-08 NOTE — Transfer of Care (Signed)
Immediate Anesthesia Transfer of Care Note  Patient: Briana Lamb  Procedure(s) Performed: TOTAL KNEE ARTHROPLASTY (Left )  Patient Location: PACU  Anesthesia Type:Spinal  Level of Consciousness: awake and oriented  Airway & Oxygen Therapy: Patient Spontanous Breathing  Post-op Assessment: Report given to RN, Post -op Vital signs reviewed and stable and Patient moving all extremities  Post vital signs: Reviewed and stable  Last Vitals:  Vitals Value Taken Time  BP    Temp    Pulse 79 01/08/2018  9:50 AM  Resp 8 01/08/2018  9:50 AM  SpO2 93 % 01/08/2018  9:50 AM  Vitals shown include unvalidated device data.  Last Pain:  Vitals:   01/08/18 0610  PainSc: 0-No pain         Complications: No apparent anesthesia complications

## 2018-01-08 NOTE — Anesthesia Preprocedure Evaluation (Addendum)
Anesthesia Evaluation  Patient identified by MRN, date of birth, ID band Patient awake    Reviewed: Allergy & Precautions, NPO status , Patient's Chart, lab work & pertinent test results  Airway Mallampati: II  TM Distance: >3 FB Neck ROM: Full    Dental  (+) Teeth Intact, Dental Advisory Given   Pulmonary neg pulmonary ROS,    breath sounds clear to auscultation       Cardiovascular negative cardio ROS   Rhythm:Regular Rate:Normal     Neuro/Psych Anxiety negative neurological ROS     GI/Hepatic negative GI ROS, Neg liver ROS,   Endo/Other  negative endocrine ROS  Renal/GU negative Renal ROS     Musculoskeletal  (+) Arthritis , Osteoarthritis,    Abdominal (+) + obese,   Peds  Hematology negative hematology ROS (+)   Anesthesia Other Findings   Reproductive/Obstetrics                          Lab Results  Component Value Date   WBC 6.8 12/28/2017   HGB 12.6 12/28/2017   HCT 39.3 12/28/2017   MCV 101.0 (H) 12/28/2017   PLT 416 (H) 12/28/2017   Lab Results  Component Value Date   INR 0.95 12/28/2017   INR 0.9 12/21/2017     Anesthesia Physical Anesthesia Plan  ASA: II  Anesthesia Plan: Spinal   Post-op Pain Management:  Regional for Post-op pain   Induction: Intravenous  PONV Risk Score and Plan: Ondansetron, Dexamethasone and Propofol infusion  Airway Management Planned: Natural Airway and Simple Face Mask  Additional Equipment: None  Intra-op Plan:   Post-operative Plan:   Informed Consent: I have reviewed the patients History and Physical, chart, labs and discussed the procedure including the risks, benefits and alternatives for the proposed anesthesia with the patient or authorized representative who has indicated his/her understanding and acceptance.   Dental advisory given  Plan Discussed with: CRNA  Anesthesia Plan Comments:        Anesthesia Quick  Evaluation

## 2018-01-08 NOTE — Evaluation (Signed)
Physical Therapy Evaluation Patient Details Name: Briana Lamb MRN: [id number] DOB: 01-21-1961 Today's Date: 01/08/2018   History of Present Illness  Pt is a 57 y/o female s/p elective L TKA. PMH includes anxiety.   Clinical Impression  Pt admitted secondary to problem above with deficits below. Pt with decreased coordination in L knee and presenting with L knee buckling so mobility limited to chair. Required min A for steadying assist with use of RW. Reviewed knee precautions and supine HEP. Will continue to follow acutely to maximize functional mobility independence and safety.     Follow Up Recommendations Follow surgeon's recommendation for DC plan and follow-up therapies;Supervision for mobility/OOB    Equipment Recommendations  Rolling walker with 5" wheels    Recommendations for Other Services       Precautions / Restrictions Precautions Precautions: Knee Precaution Booklet Issued: Yes (comment) Precaution Comments: REviewed knee precautions with pt.  Restrictions Weight Bearing Restrictions: Yes LLE Weight Bearing: Weight bearing as tolerated      Mobility  Bed Mobility Overal bed mobility: Needs Assistance Bed Mobility: Supine to Sit     Supine to sit: Supervision     General bed mobility comments: Supervision for safety. Increased time requried.   Transfers Overall transfer level: Needs assistance Equipment used: Rolling walker (2 wheeled) Transfers: Sit to/from [employer] Sit to Stand: Min assist Stand pivot transfers: Min assist       General transfer comment: Min A for lift assist and steadying. Pt with L knee buckling, so mobility limited to stand pivot to chair. Min A for steadying and cues for sequencing using RW.   Ambulation/Gait                Stairs            Wheelchair Mobility    Modified Rankin (Stroke Patients Only)       Balance Overall balance assessment: Needs assistance Sitting-balance  support: No upper extremity supported;Feet supported Sitting balance-Leahy Scale: Good     Standing balance support: Bilateral upper extremity supported;During functional activity Standing balance-Leahy Scale: Poor Standing balance comment: Reliant on BUE support                              Pertinent Vitals/Pain Pain Assessment: Faces Faces Pain Scale: Hurts little more Pain Location: L knee  Pain Descriptors / Indicators: Aching;Operative site guarding Pain Intervention(s): Limited activity within patient's tolerance;Monitored during session;Repositioned    Home Living Family/patient expects to be discharged to:: Private residence Living Arrangements: Children Available Help at Discharge: Family;Available 24 hours/day Type of Home: House Home Access: Ramped entrance     Home Layout: One level Home Equipment: Shower seat;Bedside commode      Prior Function Level of Independence: Independent         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 Able to perform ther ex below. Deficits consistent with post op pain and weakness.     Cervical / Trunk Assessment Cervical / Trunk Assessment: Normal  Communication   Communication: No difficulties  Cognition Arousal/Alertness: Awake/alert Behavior During Therapy: WFL for tasks assessed/performed Overall Cognitive Status: Within Functional Limits for tasks assessed  General Comments General comments (skin integrity, edema, etc.): Pt's daughter present during session.     Exercises Total Joint Exercises Ankle Circles/Pumps: AROM;Left;20 reps Quad Sets: AROM;Left;10 reps    Assessment/Plan    PT Assessment Patient needs continued PT services  PT Problem List Decreased strength;Decreased balance;Decreased mobility;Decreased coordination;Decreased activity tolerance;Decreased range of motion;Decreased knowledge of use of DME;Decreased knowledge of precautions;Pain;Impaired sensation       PT Treatment Interventions DME instruction;Gait training;Therapeutic activities;Functional mobility training;Therapeutic exercise;Balance training;Patient/family education    PT Goals (Current goals can be found in the Care Plan section)  Acute Rehab PT Goals Patient Stated Goal: to go home  PT Goal Formulation: With patient/family Time For Goal Achievement: 01/22/18 Potential to Achieve Goals: Good    Frequency 7X/week   Barriers to discharge        Co-evaluation               AM-PAC PT "6 Clicks" Daily Activity  Outcome Measure Difficulty turning over in bed (including adjusting bedclothes, sheets and blankets)?: A Little Difficulty moving from lying on back to sitting on the side of the bed? : A Little Difficulty sitting down on and standing up from a chair with arms (e.g., wheelchair, bedside commode, etc,.)?: Unable Help needed moving to and from a bed to chair (including a wheelchair)?: A Little Help needed walking in hospital room?: A Lot Help needed climbing 3-5 steps with a railing? : A Lot 6 Click Score: 14    End of Session Equipment Utilized During Treatment: Gait belt Activity Tolerance: Patient tolerated treatment well Patient left: in chair;with call bell/phone within reach;with family/visitor present Nurse Communication: Mobility status PT Visit Diagnosis: Unsteadiness on feet (R26.81);Muscle weakness (generalized) (M62.81)    Time: 0454-0981 PT Time Calculation (min) (ACUTE ONLY): 23 min   Charges:   PT Evaluation $PT Eval Low Complexity: 1 Low PT Treatments $Therapeutic Activity: 8-22 mins        Gladys Damme, PT,  DPT  Acute Rehabilitation Services  Pager: [phone] Office: [phone]   Lehman Prom 01/08/2018, 5:00 PM

## 2018-01-08 NOTE — Op Note (Signed)
PREOP DIAGNOSIS: DJD LEFT KNEE POSTOP DIAGNOSIS:  same PROCEDURE: LEFT TKR ANESTHESIA: Spinal and MAC ATTENDING SURGEON: Atlas Crossland G ASSISTANT: Loni Dolly PA  INDICATIONS FOR PROCEDURE: Briana Lamb is a 57 y.o. female who has struggled for a long time with pain due to degenerative arthritis of the left knee.  The patient has failed many conservative non-operative measures and at this point has pain which limits the ability to sleep and walk.  The patient is offered total knee replacement.  Informed operative consent was obtained after discussion of possible risks of anesthesia, infection, neurovascular injury, DVT, and death.  The importance of the post-operative rehabilitation protocol to optimize result was stressed extensively with the patient.  SUMMARY OF FINDINGS AND PROCEDURE:  Briana Lamb was taken to the operative suite where under the above anesthesia a left knee replacement was performed.  There were advanced degenerative changes and the bone quality was fair.  We used the DePuyAttune system and placed size 7 femur, 6 tibia, 41 mm all polyethylene patella, and a size 14 mm spacer.  Loni Dolly PA-C assisted throughout and was invaluable to the completion of the case in that he helped retract and maintain exposure while I placed the components.  He also helped close thereby minimizing OR time.  The patient was admitted for appropriate post-op care to include perioperative antibiotics and mechanical and pharmacologic measures for DVT prophylaxis.  DESCRIPTION OF PROCEDURE:  Briana Lamb was taken to the operative suite where the above anesthesia was applied.  The patient was positioned supine and prepped and draped in normal sterile fashion.  An appropriate time out was performed.  After the administration of kefzol pre-op antibiotic the leg was elevated and exsanguinated and a tourniquet inflated.  A standard longitudinal incision was made on the anterior knee.   Dissection was carried down to the extensor mechanism.  All appropriate anti-infective measures were used including the pre-operative antibiotic, betadine impregnated drape, and closed hooded exhaust systems for each member of the surgical team.  A medial parapatellar incision was made in the extensor mechanism and the knee cap flipped and the knee flexed.  Some residual meniscal tissues were removed along with any remaining ACL/PCL tissue.  A guide was placed on the tibia and a flat cut was made on it's superior surface.  An intramedullary guide was placed in the femur and was utilized to make anterior and posterior cuts creating an appropriate flexion gap.  A second intramedullary guide was placed in the femur to make a distal cut properly balancing the knee with an extension gap equal to the flexion gap.  The three bones sized to the above mentioned sizes and the appropriate guides were placed and utilized.  A trial reduction was done and the knee easily came to full extension and the patella tracked well on flexion.  The trial components were removed and all bones were cleaned with pulsatile lavage and then dried thoroughly.  Cement was mixed and was pressurized onto the bones followed by placement of the aforementioned components.  Excess cement was trimmed and pressure was held on the components until the cement had hardened.  The tourniquet was deflated and a small amount of bleeding was controlled with cautery and pressure.  The knee was irrigated thoroughly.  The extensor mechanism was re-approximated with V-loc suture in running fashion.  The knee was flexed and the repair was solid.  The subcutaneous tissues were re-approximated with #0 and #2-0 vicryl and the skin closed  with a subcuticular stitch and steristrips.  A sterile dressing was applied.  Intraoperative fluids, EBL, and tourniquet time can be obtained from anesthesia records.  DISPOSITION:  The patient was taken to recovery room in stable  condition and admitted for appropriate post-op care to include peri-operative antibiotic and DVT prophylaxis with mechanical and pharmacologic measures.  Lonza Shimabukuro G 01/08/2018, 9:18 AM

## 2018-01-08 NOTE — Anesthesia Procedure Notes (Signed)
Anesthesia Regional Block: Adductor canal block   Pre-Anesthetic Checklist: ,, timeout performed, Correct Patient, Correct Site, Correct Laterality, Correct Procedure, Correct Position, site marked, Risks and benefits discussed,  Surgical consent,  Pre-op evaluation,  At surgeon's request and post-op pain management  Laterality: Left  Prep: chloraprep       Needles:  Injection technique: Single-shot  Needle Type: Echogenic Stimulator Needle     Needle Length: 9cm  Needle Gauge: 21     Additional Needles:   Narrative:  Start time: 01/08/2018 7:10 AM End time: 01/08/2018 7:20 AM Injection made incrementally with aspirations every 5 mL.  Performed by: Personally  Anesthesiologist: Arta Bruce, MD  Additional Notes: Monitors applied. Patient sedated. Sterile prep and drape,hand hygiene and sterile gloves were used. Relevant anatomy identified.Needle position confirmed.Local anesthetic injected incrementally after negative aspiration. Local anesthetic spread visualized around nerve(s). Vascular puncture avoided. No complications. Image printed for medical record.The patient tolerated the procedure well.    Arta Bruce MD

## 2018-01-08 NOTE — Progress Notes (Signed)
Pt received to the room from PACU and pt with no signs or symptoms of pain or discomfort.  Dressing intact, skin under the dressing noted to be without discoloration.  Pedical pulses very strong to feel.  Pt able to move toes and leg at this time.  Encouraged pt to call for pain meds prior to pain becoming to intense.  Head to toe assessment done and no abnormalities noted.  Daughter in the room with the pt at this time.  Bed in low position, HOB elevated for comfort.  IV infusing LR.Marland Kitchen.  Pt tolerated meals without c/o nausea.

## 2018-01-08 NOTE — Care Management Note (Signed)
Case Management Note  Patient Details  Name: Radene GunningKimberly A Kolodziej MRN: [id number] Date of Birth: [birth year]  Subjective/Objective:   Left TKA                 Action/Plan: NCM spoke to pt and dtr, Lequita HaltMorgan at bedside. Offered choice for HH/list provided. [facility]. She has 3n1 bedside commode. Requesting RW. Contacted [facility] for RW to be delivered to room prior to dc.   Expected Discharge Date:               Expected Discharge Plan:  Home w Home Health Services  In-House Referral:  NA  Discharge planning Services  CM Consult  Post Acute Care Choice:  Home Health Choice offered to:  Patient  DME Arranged:  Walker rolling DME Agency:  [facility]  [facility]  Other - See comment  Status of Service:  Completed, signed off  If discussed at Long Length of Stay Meetings, dates discussed:    Additional Comments:  Elliot CousinShavis, Raydon Chappuis Ellen, RN 01/08/2018, 2:43 PM

## 2018-01-08 NOTE — Interval H&P Note (Signed)
History and Physical Interval Note:  01/08/2018 7:21 AM  Briana Lamb  has presented today for surgery, with the diagnosis of LEFT KNEE DEGENERATIVE JOINT DISEASE  The various methods of treatment have been discussed with the patient and family. After consideration of risks, benefits and other options for treatment, the patient has consented to  Procedure(s): TOTAL KNEE ARTHROPLASTY (Left) as a surgical intervention .  The patient's history has been reviewed, patient examined, no change in status, stable for surgery.  I have reviewed the patient's chart and labs.  Questions were answered to the patient's satisfaction.     Shakeitha Umbaugh G

## 2018-01-09 ENCOUNTER — Encounter (HOSPITAL_COMMUNITY): Payer: Self-pay | Admitting: Orthopaedic Surgery

## 2018-01-09 DIAGNOSIS — M1712 Unilateral primary osteoarthritis, left knee: Secondary | ICD-10-CM | POA: Diagnosis not present

## 2018-01-09 DIAGNOSIS — Z79899 Other long term (current) drug therapy: Secondary | ICD-10-CM | POA: Diagnosis not present

## 2018-01-09 DIAGNOSIS — Z880 Allergy status to penicillin: Secondary | ICD-10-CM | POA: Diagnosis not present

## 2018-01-09 MED ORDER — BISACODYL 5 MG PO TBEC: 5.0000 mg | DELAYED_RELEASE_TABLET | Freq: Every day | ORAL | 0 refills | Status: DC | PRN

## 2018-01-09 MED ORDER — HYDROCODONE-ACETAMINOPHEN 5-325 MG PO TABS: 1.0000 | ORAL_TABLET | Freq: Four times a day (QID) | ORAL | 0 refills | Status: DC | PRN

## 2018-01-09 MED ORDER — TIZANIDINE HCL 4 MG PO TABS: 4.0000 mg | ORAL_TABLET | Freq: Four times a day (QID) | ORAL | 1 refills | Status: AC | PRN

## 2018-01-09 MED ORDER — ASPIRIN 325 MG PO TBEC: 325.0000 mg | DELAYED_RELEASE_TABLET | Freq: Two times a day (BID) | ORAL | 0 refills | Status: DC

## 2018-01-09 MED ORDER — DOCUSATE SODIUM 100 MG PO CAPS: 100.0000 mg | ORAL_CAPSULE | Freq: Two times a day (BID) | ORAL | 0 refills | Status: DC

## 2018-01-09 NOTE — Plan of Care (Signed)

## 2018-01-09 NOTE — Progress Notes (Signed)
Physical Therapy Treatment Patient Details Name: Briana Lamb MRN: [id number] DOB: 1960/10/02 Today's Date: 01/09/2018    History of Present Illness Pt is a 57 y/o female s/p elective L TKA. PMH includes anxiety.     PT Comments    Pt continues to progress towards her goals. Pt voiced concern about getting up ramp in her home as the grade is steeper than ADA. Pt has 2 steps into home without rails. In order to maintain safety, pt trained on stairs going backward with use of RW and help from her daughter. Pt reports feeling more confident. PT reviewed HEP and answered questions on d/c. D/c planned for this afternoon.     Follow Up Recommendations  Follow surgeon's recommendation for DC plan and follow-up therapies;Supervision for mobility/OOB     Equipment Recommendations  Rolling walker with 5" wheels    Recommendations for Other Services       Precautions / Restrictions Precautions Precautions: Knee Precaution Booklet Issued: Yes (comment) Precaution Comments: REviewed knee precautions with pt.  Restrictions Weight Bearing Restrictions: Yes LLE Weight Bearing: Weight bearing as tolerated    Mobility  Bed Mobility               General bed mobility comments: sitting in recliner on entry  Transfers Overall transfer level: Needs assistance Equipment used: Rolling walker (2 wheeled) Transfers: Sit to/from BJ'sStand;Stand Pivot Transfers Sit to Stand: Min guard Stand pivot transfers: Min assist       General transfer comment: min guard for powerup and steadying  Ambulation/Gait Ambulation/Gait assistance: Min guard Gait Distance (Feet): 180 Feet Assistive device: Rolling walker (2 wheeled) Gait Pattern/deviations: Step-through pattern;Decreased weight shift to left;Trunk flexed;Antalgic;Decreased stance time - left Gait velocity: slowed Gait velocity interpretation: 1.31 - 2.62 ft/sec, indicative of limited community ambulator General Gait Details: hands  on min guard, pt able to steady herself with 1x knee buckling using increased UE support on RW   Stairs Stairs: Yes Stairs assistance: Min assist;Min guard Stair Management: Two rails;Step to pattern;No rails;With walker;Backwards;Forwards Number of Stairs: 9 General stair comments: Pt concerned ramp at home was too steep so stair trained for forward with rails and backwards with RW. Pt comfortable with going up back wards with RW and daughter steadying       Balance Overall balance assessment: Needs assistance Sitting-balance support: No upper extremity supported;Feet supported Sitting balance-Leahy Scale: Good     Standing balance support: Bilateral upper extremity supported;During functional activity Standing balance-Leahy Scale: Poor Standing balance comment: Reliant on BUE support                             Cognition Arousal/Alertness: Awake/alert Behavior During Therapy: WFL for tasks assessed/performed Overall Cognitive Status: Within Functional Limits for tasks assessed                                        Exercises Total Joint Exercises Heel Slides: Left;5 reps;Seated Hip ABduction/ADduction: Left;5 reps;Seated Straight Leg Raises: Left;5 reps;Seated Long Arc Quad: AROM;Left;10 reps Knee Flexion: AROM;Left;10 reps Goniometric ROM: 8-76 degrees AROM    General Comments General comments (skin integrity, edema, etc.): Daughter present       Pertinent Vitals/Pain Pain Assessment: Faces Faces Pain Scale: Hurts a little bit Pain Location: L knee  Pain Descriptors / Indicators: Aching;Operative site guarding Pain Intervention(s): Limited activity within  patient's tolerance;Monitored during session;Repositioned;RN gave pain meds during session           PT Goals (current goals can now be found in the care plan section) Acute Rehab PT Goals Patient Stated Goal: to go home  PT Goal Formulation: With patient/family Time For Goal  Achievement: 01/22/18 Potential to Achieve Goals: Good Progress towards PT goals: Progressing toward goals    Frequency    7X/week      PT Plan Current plan remains appropriate       AM-PAC PT "6 Clicks" Daily Activity  Outcome Measure  Difficulty turning over in bed (including adjusting bedclothes, sheets and blankets)?: A Little Difficulty moving from lying on back to sitting on the side of the bed? : A Little Difficulty sitting down on and standing up from a chair with arms (e.g., wheelchair, bedside commode, etc,.)?: Unable Help needed moving to and from a bed to chair (including a wheelchair)?: A Little Help needed walking in hospital room?: A Lot Help needed climbing 3-5 steps with a railing? : A Lot 6 Click Score: 14    End of Session Equipment Utilized During Treatment: Gait belt Activity Tolerance: Patient tolerated treatment well Patient left: in chair;with call bell/phone within reach;with family/visitor present Nurse Communication: Mobility status PT Visit Diagnosis: Unsteadiness on feet (R26.81);Muscle weakness (generalized) (M62.81)     Time: 1610-9604 PT Time Calculation (min) (ACUTE ONLY): 34 min  Charges:  $Gait Training: 8-22 mins $Therapeutic Exercise: 8-22 mins                     Sy Saintjean B. Beverely Risen PT, DPT Acute Rehabilitation Services Pager [phone] Office [phone]    Briana Lamb 01/09/2018, 2:46 PM

## 2018-01-09 NOTE — Progress Notes (Signed)
Subjective: 1 Day Post-Op Procedure(s) (LRB): TOTAL KNEE ARTHROPLASTY (Left)   Patient is doing well and hopes to go home today after PT if moving well.  Activity level:  wbat Diet tolerance:  ok Voiding:  ok Patient reports pain as mild.    Objective: Vital signs in last 24 hours: Temp:  [97.2 F (36.2 C)-98 F (36.7 C)] 97.4 F (36.3 C) (11/13 0405) Pulse Rate:  [56-82] 79 (11/13 0552) Resp:  [12-16] 16 (11/13 0405) BP: (101-155)/(56-125) 111/56 (11/13 0405) SpO2:  [90 %-98 %] 96 % (11/13 0552)  Labs: No results for input(s): HGB in the last 72 hours. No results for input(s): WBC, RBC, HCT, PLT in the last 72 hours. No results for input(s): NA, K, CL, CO2, BUN, CREATININE, GLUCOSE, CALCIUM in the last 72 hours. No results for input(s): LABPT, INR in the last 72 hours.  Physical Exam:  Neurologically intact ABD soft Neurovascular intact Sensation intact distally Intact pulses distally Dorsiflexion/Plantar flexion intact Incision: dressing C/D/I and no drainage No cellulitis present Compartment soft  Assessment/Plan:  1 Day Post-Op Procedure(s) (LRB): TOTAL KNEE ARTHROPLASTY (Left) Advance diet Up with therapy Discharge home with home health today after PT if doing well. Follow up in office 2 weeks post op Continue on ASA 325 mg BID x 2 weeks post op  Patient's anticipated LOS is less than 2 midnights, meeting these requirements: - Younger than 465 - Lives within 1 hour of care - Has a competent adult at home to recover with post-op recover - NO history of  - Chronic pain requiring opiods  - Diabetes  - Coronary Artery Disease  - Heart failure  - Heart attack  - Stroke  - DVT/VTE  - Cardiac arrhythmia  - Respiratory Failure/COPD  - Renal failure  - Anemia  - Advanced Liver disease   Marsia Cino PAUL 01/09/2018, 8:01 AM

## 2018-01-09 NOTE — Progress Notes (Signed)
CSW acknowledges SNF consult, patient will be going home.   CSW signing off.   [place] Green-Green RidgeAshley Christien Frankl, [place]CSW [phone]

## 2018-01-09 NOTE — Discharge Summary (Signed)
Patient ID: Briana Lamb MRN: [id number] DOB/AGE: 10/11/1960 57 y.o.  Admit date: 01/08/2018 Discharge date: 01/09/2018  Admission Diagnoses:  Principal Problem:   Primary osteoarthritis of left knee   Discharge Diagnoses:  Same  Past Medical History:  Diagnosis Date  . Anxiety   . Arthritis    "knees" (01/08/2018)  . Bee sting allergy   . Seasonal allergies     Surgeries: Procedure(s): TOTAL KNEE ARTHROPLASTY on 01/08/2018   Consultants:   Discharged Condition: Improved  Hospital Course: Briana GunningKimberly A Maillet is an 57 y.o. female who was admitted 01/08/2018 for operative treatment ofPrimary osteoarthritis of left knee. Patient has severe unremitting pain that affects sleep, daily activities, and work/hobbies. After pre-op clearance the patient was taken to the operating room on 01/08/2018 and underwent  Procedure(s): TOTAL KNEE ARTHROPLASTY.    Patient was given perioperative antibiotics:  Anti-infectives (From admission, onward)   Start     Dose/Rate Route Frequency Ordered Stop   01/08/18 1400  ceFAZolin (ANCEF) IVPB 2g/100 mL premix     2 g 200 mL/hr over 30 Minutes Intravenous Every 6 hours 01/08/18 1221 01/09/18 0802   01/08/18 0600  ceFAZolin (ANCEF) IVPB 2g/100 mL premix     2 g 200 mL/hr over 30 Minutes Intravenous On call to O.R. 01/08/18 [id number] 01/08/18 [id number]       Patient was given sequential compression devices, early ambulation, and chemoprophylaxis to prevent DVT.  Patient benefited maximally from hospital stay and there were no complications.    Recent vital signs:  Patient Vitals for the past 24 hrs:  BP Temp Temp src Pulse Resp SpO2  01/09/18 0552 - - - 79 - 96 %  01/09/18 0405 (!) 111/56 (!) 97.4 F (36.3 C) Oral (!) 56 16 96 %  01/08/18 2355 106/60 (!) 97.5 F (36.4 C) Oral 60 16 98 %  01/08/18 1942 104/60 - - - - -  01/08/18 1941 (!) 155/125 98 F (36.7 C) Oral 77 16 90 %  01/08/18 1218 125/62 - - 75 15 96 %  01/08/18 1153 - (!)  97.2 F (36.2 C) - - - -  01/08/18 1151 117/71 - - 72 14 97 %  01/08/18 1145 - - - 73 13 97 %  01/08/18 1136 112/67 - - 74 12 97 %  01/08/18 1130 - - - 68 13 98 %  01/08/18 1121 (!) 113/58 - - 69 13 96 %  01/08/18 1115 111/66 - - 70 16 95 %  01/08/18 1106 111/66 - - 69 12 95 %  01/08/18 1100 112/62 - - 66 13 97 %  01/08/18 1053 112/62 - - 70 14 97 %  01/08/18 1045 - - - 67 12 97 %  01/08/18 1036 102/62 - - 70 15 97 %  01/08/18 1030 - - - 64 13 97 %  01/08/18 1021 105/62 - - 72 12 97 %  01/08/18 1015 - - - 73 14 91 %  01/08/18 1006 102/66 - - 74 13 92 %  01/08/18 1000 - - - 75 13 90 %  01/08/18 0952 101/60 (!) 97.2 F (36.2 C) - 82 14 90 %     Recent laboratory studies: No results for input(s): WBC, HGB, HCT, PLT, NA, K, CL, CO2, BUN, CREATININE, GLUCOSE, INR, CALCIUM in the last 72 hours.  Invalid input(s): PT, 2   Discharge Medications:   Allergies as of 01/09/2018      Reactions   Penicillins Swelling, Other (See Comments)  SWELLING REACTION UNSPECIFIED  Has patient had a PCN reaction causing immediate rash, facial/tongue/throat swelling, SOB or lightheadedness with hypotension: unkn Has patient had a PCN reaction causing severe rash involving mucus membranes or skin necrosis: unkn Has patient had a PCN reaction that required hospitalization: unkn Has patient had a PCN reaction occurring within the last 10 years: unkn If all of the above answers are "NO", then may proceed with Cephalosporin use.   Bee Venom Swelling   SWELLING LOCALIZED      Medication List    TAKE these medications   aspirin 325 MG EC tablet Take 1 tablet (325 mg total) by mouth 2 (two) times daily after a meal.   b complex vitamins tablet Take 1 tablet by mouth daily.   BC FAST PAIN RELIEF 650-195-33.3 MG Pack Generic drug:  Aspirin-Salicylamide-Caffeine Take 1 Package by mouth daily as needed (pain).   bisacodyl 5 MG EC tablet Commonly known as:  DULCOLAX Take 1 tablet (5 mg total) by  mouth daily as needed for moderate constipation.   docusate sodium 100 MG capsule Commonly known as:  COLACE Take 1 capsule (100 mg total) by mouth 2 (two) times daily.   Echinacea 125 MG Caps Take 1 capsule by mouth daily as needed (colder months).   HYDROcodone-acetaminophen 5-325 MG tablet Commonly known as:  NORCO/VICODIN Take 1-2 tablets by mouth every 6 (six) hours as needed for moderate pain (pain score 4-6).   multivitamin with minerals tablet Take 1 tablet by mouth daily.   tiZANidine 4 MG tablet Commonly known as:  ZANAFLEX Take 1 tablet (4 mg total) by mouth every 6 (six) hours as needed.   vitamin C 1000 MG tablet Take 1,000 mg by mouth daily.            Durable Medical Equipment  (From admission, onward)         Start     Ordered   01/08/18 1222  DME Walker rolling  Once    Question:  Patient needs a walker to treat with the following condition  Answer:  Primary osteoarthritis of left knee   01/08/18 1221   01/08/18 1222  DME 3 n 1  Once     01/08/18 1221   01/08/18 1222  DME Bedside commode  Once    Question:  Patient needs a bedside commode to treat with the following condition  Answer:  Primary osteoarthritis of left knee   01/08/18 1221          Diagnostic Studies: Dg Chest 2 View  Result Date: 12/28/2017 CLINICAL DATA:  Preoperative evaluation for total knee replacement. EXAM: CHEST - 2 VIEW COMPARISON:  None. FINDINGS: Lungs are clear. Heart size and pulmonary vascularity are normal. No adenopathy. There is mild prominence of epicardial fat along the right heart border. There is degenerative change in the thoracic spine. IMPRESSION: No edema or consolidation. Electronically Signed   By: Bretta Bang III M.D.   On: 12/28/2017 15:54    Disposition: Discharge disposition: 01-Home or Self Care       Discharge Instructions    Call MD / Call 911   Complete by:  As directed    If you experience chest pain or shortness of breath, CALL 911  and be transported to the hospital emergency room.  If you develope a fever above 101 F, pus (white drainage) or increased drainage or redness at the wound, or calf pain, call your surgeon's office.   Constipation Prevention   Complete  by:  As directed    Drink plenty of fluids.  Prune juice may be helpful.  You may use a stool softener, such as Colace (over the counter) 100 mg twice a day.  Use MiraLax (over the counter) for constipation as needed.   Diet - low sodium heart healthy   Complete by:  As directed    Discharge instructions   Complete by:  As directed    INSTRUCTIONS AFTER JOINT REPLACEMENT   Remove items at home which could result in a fall. This includes throw rugs or furniture in walking pathways ICE to the affected joint every three hours while awake for 30 minutes at a time, for at least the first 3-5 days, and then as needed for pain and swelling.  Continue to use ice for pain and swelling. You may notice swelling that will progress down to the foot and ankle.  This is normal after surgery.  Elevate your leg when you are not up walking on it.   Continue to use the breathing machine you got in the hospital (incentive spirometer) which will help keep your temperature down.  It is common for your temperature to cycle up and down following surgery, especially at night when you are not up moving around and exerting yourself.  The breathing machine keeps your lungs expanded and your temperature down.   DIET:  As you were doing prior to hospitalization, we recommend a well-balanced diet.  DRESSING / WOUND CARE / SHOWERING  You may shower 3 days after surgery, but keep the wounds dry during showering.  You may use an occlusive plastic wrap (Press'n Seal for example), NO SOAKING/SUBMERGING IN THE BATHTUB.  If the bandage gets wet, change with a clean dry gauze.  If the incision gets wet, pat the wound dry with a clean towel.  ACTIVITY  Increase activity slowly as tolerated, but follow  the weight bearing instructions below.   No driving for 6 weeks or until further direction given by your physician.  You cannot drive while taking narcotics.  No lifting or carrying greater than 10 lbs. until further directed by your surgeon. Avoid periods of inactivity such as sitting longer than an hour when not asleep. This helps prevent blood clots.  You may return to work once you are authorized by your doctor.     WEIGHT BEARING   Weight bearing as tolerated with assist device (walker, cane, etc) as directed, use it as long as suggested by your surgeon or therapist, typically at least 4-6 weeks.   EXERCISES  Results after joint replacement surgery are often greatly improved when you follow the exercise, range of motion and muscle strengthening exercises prescribed by your doctor. Safety measures are also important to protect the joint from further injury. Any time any of these exercises cause you to have increased pain or swelling, decrease what you are doing until you are comfortable again and then slowly increase them. If you have problems or questions, call your caregiver or physical therapist for advice.   Rehabilitation is important following a joint replacement. After just a few days of immobilization, the muscles of the leg can become weakened and shrink (atrophy).  These exercises are designed to build up the tone and strength of the thigh and leg muscles and to improve motion. Often times heat used for twenty to thirty minutes before working out will loosen up your tissues and help with improving the range of motion but do not use heat for the first  two weeks following surgery (sometimes heat can increase post-operative swelling).   These exercises can be done on a training (exercise) mat, on the floor, on a table or on a bed. Use whatever works the best and is most comfortable for you.    Use music or television while you are exercising so that the exercises are a pleasant break in  your day. This will make your life better with the exercises acting as a break in your routine that you can look forward to.   Perform all exercises about fifteen times, three times per day or as directed.  You should exercise both the operative leg and the other leg as well.   Exercises include:   Quad Sets - Tighten up the muscle on the front of the thigh (Quad) and hold for 5-10 seconds.   Straight Leg Raises - With your knee straight (if you were given a brace, keep it on), lift the leg to 60 degrees, hold for 3 seconds, and slowly lower the leg.  Perform this exercise against resistance later as your leg gets stronger.  Leg Slides: Lying on your back, slowly slide your foot toward your buttocks, bending your knee up off the floor (only go as far as is comfortable). Then slowly slide your foot back down until your leg is flat on the floor again.  Angel Wings: Lying on your back spread your legs to the side as far apart as you can without causing discomfort.  Hamstring Strength:  Lying on your back, push your heel against the floor with your leg straight by tightening up the muscles of your buttocks.  Repeat, but this time bend your knee to a comfortable angle, and push your heel against the floor.  You may put a pillow under the heel to make it more comfortable if necessary.   A rehabilitation program following joint replacement surgery can speed recovery and prevent re-injury in the future due to weakened muscles. Contact your doctor or a physical therapist for more information on knee rehabilitation.    CONSTIPATION  Constipation is defined medically as fewer than three stools per week and severe constipation as less than one stool per week.  Even if you have a regular bowel pattern at home, your normal regimen is likely to be disrupted due to multiple reasons following surgery.  Combination of anesthesia, postoperative narcotics, change in appetite and fluid intake all can affect your bowels.    YOU MUST use at least one of the following options; they are listed in order of increasing strength to get the job done.  They are all available over the counter, and you may need to use some, POSSIBLY even all of these options:    Drink plenty of fluids (prune juice may be helpful) and high fiber foods Colace 100 mg by mouth twice a day  Senokot for constipation as directed and as needed Dulcolax (bisacodyl), take with full glass of water  Miralax (polyethylene glycol) once or twice a day as needed.  If you have tried all these things and are unable to have a bowel movement in the first 3-4 days after surgery call either your surgeon or your primary doctor.    If you experience loose stools or diarrhea, hold the medications until you stool forms back up.  If your symptoms do not get better within 1 week or if they get worse, check with your doctor.  If you experience "the worst abdominal pain ever" or develop nausea or  vomiting, please contact the office immediately for further recommendations for treatment.   ITCHING:  If you experience itching with your medications, try taking only a single pain pill, or even half a pain pill at a time.  You can also use Benadryl over the counter for itching or also to help with sleep.   TED HOSE STOCKINGS:  Use stockings on both legs until for at least 2 weeks or as directed by physician office. They may be removed at night for sleeping.  MEDICATIONS:  See your medication summary on the "After Visit Summary" that nursing will review with you.  You may have some home medications which will be placed on hold until you complete the course of blood thinner medication.  It is important for you to complete the blood thinner medication as prescribed.  PRECAUTIONS:  If you experience chest pain or shortness of breath - call 911 immediately for transfer to the hospital emergency department.   If you develop a fever greater that 101 F, purulent drainage from wound,  increased redness or drainage from wound, foul odor from the wound/dressing, or calf pain - CONTACT YOUR SURGEON.                                                   FOLLOW-UP APPOINTMENTS:  If you do not already have a post-op appointment, please call the office for an appointment to be seen by your surgeon.  Guidelines for how soon to be seen are listed in your "After Visit Summary", but are typically between 1-4 weeks after surgery.  OTHER INSTRUCTIONS:   Knee Replacement:  Do not place pillow under knee, focus on keeping the knee straight while resting. CPM instructions: 0-90 degrees, 2 hours in the morning, 2 hours in the afternoon, and 2 hours in the evening. Place foam block, curve side up under heel at all times except when in CPM or when walking.  DO NOT modify, tear, cut, or change the foam block in any way.  MAKE SURE YOU:  Understand these instructions.  Get help right away if you are not doing well or get worse.    Thank you for letting us be a part of your medical care team.  It is a privilege we respect greatly.  We hope these instructions will help you stay on track for a fast and full recovery!   Increase activity slowly as tolerated   Complete by:  As directed       Follow-up Information    Marcene Corning, MD. Schedule an appointment as soon as possible for a visit in 2 weeks.   Specialty:  Orthopedic Surgery Contact information: [address] [place] [address] [phone]        Home, Medi Follow up.   Why:  Home Health Physical Therapy-agency will call to arrange initial visit Contact information: [address] [place] [address] [phone]            Signed: Drema Halon 01/09/2018, 8:04 AM

## 2018-01-09 NOTE — Progress Notes (Signed)
Discharge instructions completed with pt.  Pt verbalized understanding of the information.  Pt denies chest pain, shortness of breath, dizziness, lightheadedness, and n/v.  Pt's IV discontinued.  Pt discharged home.  

## 2018-01-09 NOTE — Progress Notes (Signed)
Physical Therapy Treatment Patient Details Name: Briana Lamb MRN: [id number] DOB: May 18, 1960 Today's Date: 01/09/2018    History of Present Illness Pt is a 57 y/o female s/p elective L TKA. PMH includes anxiety.     PT Comments    Pt making good progress today with therapy, however continues to be limited in safe mobility by pain and L knee buckling due to muscle weakness in terminal extension. Pt min A for transfers and for steadying with initial ambulation. Pt educated on need for use of UE and RW to steady herself until her muscle strengthen.  Pt able to progress to min guard with ambulation with ability to stabilize herself with knee buckling. Recommendation for HHPT remain appropriate. PT will review HEP with pt prior to d/c home this afternoon.    Follow Up Recommendations  Follow surgeon's recommendation for DC plan and follow-up therapies;Supervision for mobility/OOB     Equipment Recommendations  Rolling walker with 5" wheels    Recommendations for Other Services       Precautions / Restrictions Precautions Precautions: Knee Precaution Booklet Issued: Yes (comment) Precaution Comments: REviewed knee precautions with pt.  Restrictions Weight Bearing Restrictions: Yes LLE Weight Bearing: Weight bearing as tolerated    Mobility  Bed Mobility               General bed mobility comments: sitting EoB on entry  Transfers Overall transfer level: Needs assistance Equipment used: Rolling walker (2 wheeled) Transfers: Sit to/from [employer] Sit to Stand: Min assist Stand pivot transfers: Min assist       General transfer comment: min A for powerup and steadying, minA for steadying with L knee buckling in pivoting to BSC, min guard for sit>stand from [facility] and from recliner  Ambulation/Gait Ambulation/Gait assistance: Min assist Gait Distance (Feet): 250 Feet Assistive device: Rolling walker (2 wheeled) Gait Pattern/deviations:  Step-through pattern;Decreased weight shift to left;Trunk flexed;Antalgic;Decreased stance time - left Gait velocity: slowed Gait velocity interpretation: 1.31 - 2.62 ft/sec, indicative of limited community ambulator General Gait Details: min A for steadying with periodic knee buckling, secondary to muscle weakness in end range of extension which was previously limited due to boney block. Pt educated on need to stay close to the RW and utilize UE when/if knee buckles. pt able to progress to min guard with knee buckling          Balance Overall balance assessment: Needs assistance Sitting-balance support: No upper extremity supported;Feet supported Sitting balance-Leahy Scale: Good     Standing balance support: Bilateral upper extremity supported;During functional activity Standing balance-Leahy Scale: Poor Standing balance comment: Reliant on BUE support                             Cognition Arousal/Alertness: Awake/alert Behavior During Therapy: WFL for tasks assessed/performed Overall Cognitive Status: Within Functional Limits for tasks assessed                                        Exercises Total Joint Exercises Long Arc Quad: AROM;Left;10 reps Knee Flexion: AROM;Left;10 reps    General Comments General comments (skin integrity, edema, etc.): Daughter present throughout session      Pertinent Vitals/Pain Pain Assessment: Faces Faces Pain Scale: Hurts a little bit Pain Location: L knee  Pain Descriptors / Indicators: Aching;Operative site guarding Pain Intervention(s): Limited activity  within patient's tolerance;Monitored during session;Repositioned           PT Goals (current goals can now be found in the care plan section) Acute Rehab PT Goals Patient Stated Goal: to go home  PT Goal Formulation: With patient/family Time For Goal Achievement: 01/22/18 Potential to Achieve Goals: Good Progress towards PT goals: Progressing toward  goals    Frequency    7X/week      PT Plan Current plan remains appropriate    Co-evaluation              AM-PAC PT "6 Clicks" Daily Activity  Outcome Measure  Difficulty turning over in bed (including adjusting bedclothes, sheets and blankets)?: A Little Difficulty moving from lying on back to sitting on the side of the bed? : A Little Difficulty sitting down on and standing up from a chair with arms (e.g., wheelchair, bedside commode, etc,.)?: Unable Help needed moving to and from a bed to chair (including a wheelchair)?: A Little Help needed walking in hospital room?: A Lot Help needed climbing 3-5 steps with a railing? : A Lot 6 Click Score: 14    End of Session Equipment Utilized During Treatment: Gait belt Activity Tolerance: Patient tolerated treatment well Patient left: in chair;with call bell/phone within reach;with family/visitor present Nurse Communication: Mobility status PT Visit Diagnosis: Unsteadiness on feet (R26.81);Muscle weakness (generalized) (M62.81)     Time: 8119-14781126-1152 PT Time Calculation (min) (ACUTE ONLY): 26 min  Charges:  $Gait Training: 8-22 mins $Therapeutic Exercise: 8-22 mins                     Zenaya Ulatowski B. Beverely RisenVan Fleet PT, DPT Acute Rehabilitation Services Pager [phone] Office [phone]    Elon Alaslizabeth B Van Fleet 01/09/2018, 1:37 PM

## 2018-01-10 DIAGNOSIS — Z96652 Presence of left artificial knee joint: Secondary | ICD-10-CM | POA: Diagnosis not present

## 2018-01-10 DIAGNOSIS — M1711 Unilateral primary osteoarthritis, right knee: Secondary | ICD-10-CM | POA: Diagnosis not present

## 2018-01-10 DIAGNOSIS — F419 Anxiety disorder, unspecified: Secondary | ICD-10-CM | POA: Diagnosis not present

## 2018-01-10 DIAGNOSIS — Z471 Aftercare following joint replacement surgery: Secondary | ICD-10-CM | POA: Diagnosis not present

## 2018-01-11 DIAGNOSIS — M1711 Unilateral primary osteoarthritis, right knee: Secondary | ICD-10-CM | POA: Diagnosis not present

## 2018-01-11 DIAGNOSIS — Z96652 Presence of left artificial knee joint: Secondary | ICD-10-CM | POA: Diagnosis not present

## 2018-01-11 DIAGNOSIS — F419 Anxiety disorder, unspecified: Secondary | ICD-10-CM | POA: Diagnosis not present

## 2018-01-11 DIAGNOSIS — Z471 Aftercare following joint replacement surgery: Secondary | ICD-10-CM | POA: Diagnosis not present

## 2018-01-14 DIAGNOSIS — Z96652 Presence of left artificial knee joint: Secondary | ICD-10-CM | POA: Diagnosis not present

## 2018-01-14 DIAGNOSIS — F419 Anxiety disorder, unspecified: Secondary | ICD-10-CM | POA: Diagnosis not present

## 2018-01-14 DIAGNOSIS — M1711 Unilateral primary osteoarthritis, right knee: Secondary | ICD-10-CM | POA: Diagnosis not present

## 2018-01-14 DIAGNOSIS — Z471 Aftercare following joint replacement surgery: Secondary | ICD-10-CM | POA: Diagnosis not present

## 2018-01-16 DIAGNOSIS — M1711 Unilateral primary osteoarthritis, right knee: Secondary | ICD-10-CM | POA: Diagnosis not present

## 2018-01-16 DIAGNOSIS — Z96652 Presence of left artificial knee joint: Secondary | ICD-10-CM | POA: Diagnosis not present

## 2018-01-16 DIAGNOSIS — Z471 Aftercare following joint replacement surgery: Secondary | ICD-10-CM | POA: Diagnosis not present

## 2018-01-16 DIAGNOSIS — F419 Anxiety disorder, unspecified: Secondary | ICD-10-CM | POA: Diagnosis not present

## 2018-01-17 DIAGNOSIS — F419 Anxiety disorder, unspecified: Secondary | ICD-10-CM | POA: Diagnosis not present

## 2018-01-17 DIAGNOSIS — Z96652 Presence of left artificial knee joint: Secondary | ICD-10-CM | POA: Diagnosis not present

## 2018-01-17 DIAGNOSIS — M1711 Unilateral primary osteoarthritis, right knee: Secondary | ICD-10-CM | POA: Diagnosis not present

## 2018-01-17 DIAGNOSIS — Z471 Aftercare following joint replacement surgery: Secondary | ICD-10-CM | POA: Diagnosis not present

## 2018-01-18 DIAGNOSIS — M1712 Unilateral primary osteoarthritis, left knee: Secondary | ICD-10-CM | POA: Diagnosis not present

## 2018-01-21 DIAGNOSIS — F419 Anxiety disorder, unspecified: Secondary | ICD-10-CM | POA: Diagnosis not present

## 2018-01-21 DIAGNOSIS — Z96652 Presence of left artificial knee joint: Secondary | ICD-10-CM | POA: Diagnosis not present

## 2018-01-21 DIAGNOSIS — M1711 Unilateral primary osteoarthritis, right knee: Secondary | ICD-10-CM | POA: Diagnosis not present

## 2018-01-21 DIAGNOSIS — Z471 Aftercare following joint replacement surgery: Secondary | ICD-10-CM | POA: Diagnosis not present

## 2018-01-23 DIAGNOSIS — Z471 Aftercare following joint replacement surgery: Secondary | ICD-10-CM | POA: Diagnosis not present

## 2018-01-23 DIAGNOSIS — M1711 Unilateral primary osteoarthritis, right knee: Secondary | ICD-10-CM | POA: Diagnosis not present

## 2018-01-23 DIAGNOSIS — Z96652 Presence of left artificial knee joint: Secondary | ICD-10-CM | POA: Diagnosis not present

## 2018-01-23 DIAGNOSIS — F419 Anxiety disorder, unspecified: Secondary | ICD-10-CM | POA: Diagnosis not present

## 2018-01-25 DIAGNOSIS — Z96652 Presence of left artificial knee joint: Secondary | ICD-10-CM | POA: Diagnosis not present

## 2018-01-25 DIAGNOSIS — Z471 Aftercare following joint replacement surgery: Secondary | ICD-10-CM | POA: Diagnosis not present

## 2018-01-25 DIAGNOSIS — F419 Anxiety disorder, unspecified: Secondary | ICD-10-CM | POA: Diagnosis not present

## 2018-01-25 DIAGNOSIS — M1711 Unilateral primary osteoarthritis, right knee: Secondary | ICD-10-CM | POA: Diagnosis not present

## 2018-01-29 DIAGNOSIS — M25662 Stiffness of left knee, not elsewhere classified: Secondary | ICD-10-CM | POA: Diagnosis not present

## 2018-01-29 DIAGNOSIS — Z96652 Presence of left artificial knee joint: Secondary | ICD-10-CM | POA: Diagnosis not present

## 2018-02-01 DIAGNOSIS — M25662 Stiffness of left knee, not elsewhere classified: Secondary | ICD-10-CM | POA: Diagnosis not present

## 2018-02-01 DIAGNOSIS — M1712 Unilateral primary osteoarthritis, left knee: Secondary | ICD-10-CM | POA: Diagnosis not present

## 2018-02-01 DIAGNOSIS — Z96652 Presence of left artificial knee joint: Secondary | ICD-10-CM | POA: Diagnosis not present

## 2018-02-04 DIAGNOSIS — M25662 Stiffness of left knee, not elsewhere classified: Secondary | ICD-10-CM | POA: Diagnosis not present

## 2018-02-04 DIAGNOSIS — Z96652 Presence of left artificial knee joint: Secondary | ICD-10-CM | POA: Diagnosis not present

## 2018-02-06 DIAGNOSIS — Z96652 Presence of left artificial knee joint: Secondary | ICD-10-CM | POA: Diagnosis not present

## 2018-02-06 DIAGNOSIS — M25662 Stiffness of left knee, not elsewhere classified: Secondary | ICD-10-CM | POA: Diagnosis not present

## 2018-02-08 DIAGNOSIS — M25662 Stiffness of left knee, not elsewhere classified: Secondary | ICD-10-CM | POA: Diagnosis not present

## 2018-02-08 DIAGNOSIS — Z96652 Presence of left artificial knee joint: Secondary | ICD-10-CM | POA: Diagnosis not present

## 2018-02-11 DIAGNOSIS — M25662 Stiffness of left knee, not elsewhere classified: Secondary | ICD-10-CM | POA: Diagnosis not present

## 2018-02-11 DIAGNOSIS — Z96652 Presence of left artificial knee joint: Secondary | ICD-10-CM | POA: Diagnosis not present

## 2018-02-13 DIAGNOSIS — M25662 Stiffness of left knee, not elsewhere classified: Secondary | ICD-10-CM | POA: Diagnosis not present

## 2018-02-13 DIAGNOSIS — Z96652 Presence of left artificial knee joint: Secondary | ICD-10-CM | POA: Diagnosis not present

## 2018-02-15 DIAGNOSIS — M25662 Stiffness of left knee, not elsewhere classified: Secondary | ICD-10-CM | POA: Diagnosis not present

## 2018-02-15 DIAGNOSIS — Z96652 Presence of left artificial knee joint: Secondary | ICD-10-CM | POA: Diagnosis not present

## 2018-02-26 DIAGNOSIS — Z96652 Presence of left artificial knee joint: Secondary | ICD-10-CM | POA: Diagnosis not present

## 2018-02-26 DIAGNOSIS — M25662 Stiffness of left knee, not elsewhere classified: Secondary | ICD-10-CM | POA: Diagnosis not present

## 2018-03-01 DIAGNOSIS — M25662 Stiffness of left knee, not elsewhere classified: Secondary | ICD-10-CM | POA: Diagnosis not present

## 2018-03-01 DIAGNOSIS — Z96652 Presence of left artificial knee joint: Secondary | ICD-10-CM | POA: Diagnosis not present

## 2018-03-01 DIAGNOSIS — M1712 Unilateral primary osteoarthritis, left knee: Secondary | ICD-10-CM | POA: Diagnosis not present

## 2018-03-04 DIAGNOSIS — Z96652 Presence of left artificial knee joint: Secondary | ICD-10-CM | POA: Diagnosis not present

## 2018-03-04 DIAGNOSIS — M25662 Stiffness of left knee, not elsewhere classified: Secondary | ICD-10-CM | POA: Diagnosis not present

## 2018-03-11 DIAGNOSIS — Z96652 Presence of left artificial knee joint: Secondary | ICD-10-CM | POA: Diagnosis not present

## 2018-03-11 DIAGNOSIS — M25662 Stiffness of left knee, not elsewhere classified: Secondary | ICD-10-CM | POA: Diagnosis not present

## 2018-03-13 DIAGNOSIS — M25662 Stiffness of left knee, not elsewhere classified: Secondary | ICD-10-CM | POA: Diagnosis not present

## 2018-03-13 DIAGNOSIS — Z96652 Presence of left artificial knee joint: Secondary | ICD-10-CM | POA: Diagnosis not present

## 2018-03-18 DIAGNOSIS — M25662 Stiffness of left knee, not elsewhere classified: Secondary | ICD-10-CM | POA: Diagnosis not present

## 2018-03-18 DIAGNOSIS — Z96652 Presence of left artificial knee joint: Secondary | ICD-10-CM | POA: Diagnosis not present

## 2018-03-20 DIAGNOSIS — Z96652 Presence of left artificial knee joint: Secondary | ICD-10-CM | POA: Diagnosis not present

## 2018-03-20 DIAGNOSIS — M25662 Stiffness of left knee, not elsewhere classified: Secondary | ICD-10-CM | POA: Diagnosis not present

## 2018-03-25 DIAGNOSIS — M25662 Stiffness of left knee, not elsewhere classified: Secondary | ICD-10-CM | POA: Diagnosis not present

## 2018-03-25 DIAGNOSIS — Z96652 Presence of left artificial knee joint: Secondary | ICD-10-CM | POA: Diagnosis not present

## 2018-03-27 DIAGNOSIS — Z96652 Presence of left artificial knee joint: Secondary | ICD-10-CM | POA: Diagnosis not present

## 2018-03-27 DIAGNOSIS — M25662 Stiffness of left knee, not elsewhere classified: Secondary | ICD-10-CM | POA: Diagnosis not present

## 2018-03-29 DIAGNOSIS — M1712 Unilateral primary osteoarthritis, left knee: Secondary | ICD-10-CM | POA: Diagnosis not present

## 2018-04-01 DIAGNOSIS — Z96652 Presence of left artificial knee joint: Secondary | ICD-10-CM | POA: Diagnosis not present

## 2018-04-01 DIAGNOSIS — M25662 Stiffness of left knee, not elsewhere classified: Secondary | ICD-10-CM | POA: Diagnosis not present

## 2018-04-03 DIAGNOSIS — M25662 Stiffness of left knee, not elsewhere classified: Secondary | ICD-10-CM | POA: Diagnosis not present

## 2018-04-03 DIAGNOSIS — Z96652 Presence of left artificial knee joint: Secondary | ICD-10-CM | POA: Diagnosis not present

## 2018-04-08 DIAGNOSIS — Z96652 Presence of left artificial knee joint: Secondary | ICD-10-CM | POA: Diagnosis not present

## 2018-04-08 DIAGNOSIS — M25662 Stiffness of left knee, not elsewhere classified: Secondary | ICD-10-CM | POA: Diagnosis not present

## 2018-04-11 DIAGNOSIS — Z96652 Presence of left artificial knee joint: Secondary | ICD-10-CM | POA: Diagnosis not present

## 2018-04-11 DIAGNOSIS — M25662 Stiffness of left knee, not elsewhere classified: Secondary | ICD-10-CM | POA: Diagnosis not present

## 2018-04-23 DIAGNOSIS — Z96652 Presence of left artificial knee joint: Secondary | ICD-10-CM | POA: Diagnosis not present

## 2018-04-23 DIAGNOSIS — M25662 Stiffness of left knee, not elsewhere classified: Secondary | ICD-10-CM | POA: Diagnosis not present

## 2018-04-25 DIAGNOSIS — Z96652 Presence of left artificial knee joint: Secondary | ICD-10-CM | POA: Diagnosis not present

## 2018-04-25 DIAGNOSIS — M25662 Stiffness of left knee, not elsewhere classified: Secondary | ICD-10-CM | POA: Diagnosis not present

## 2019-02-13 ENCOUNTER — Ambulatory Visit: Payer: Federal, State, Local not specified - PPO | Attending: Internal Medicine

## 2019-02-13 DIAGNOSIS — Z20828 Contact with and (suspected) exposure to other viral communicable diseases: Secondary | ICD-10-CM | POA: Diagnosis not present

## 2019-02-13 DIAGNOSIS — Z20822 Contact with and (suspected) exposure to covid-19: Secondary | ICD-10-CM

## 2019-02-14 LAB — NOVEL CORONAVIRUS, NAA: SARS-CoV-2, NAA: DETECTED — AB

## 2019-02-15 ENCOUNTER — Telehealth: Payer: Self-pay | Admitting: Unknown Physician Specialty

## 2019-02-15 NOTE — Telephone Encounter (Signed)
Called to discuss with patient about Covid symptoms and the use of bamlanivimab, a monoclonal antibody infusion for those with mild to moderate Covid symptoms and at a high risk of hospitalization.  Pt is qualified for this infusion at the [facility] due to Age >55 and Hypertension   Message left to call back  

## 2019-05-05 IMAGING — CR DG CHEST 2V
2 series · 2 of 2 positions shown · non-contrast
Comparison: None.

CLINICAL DATA: Preoperative evaluation for total knee replacement.

EXAM:
CHEST - 2 VIEW

[w chest pa]
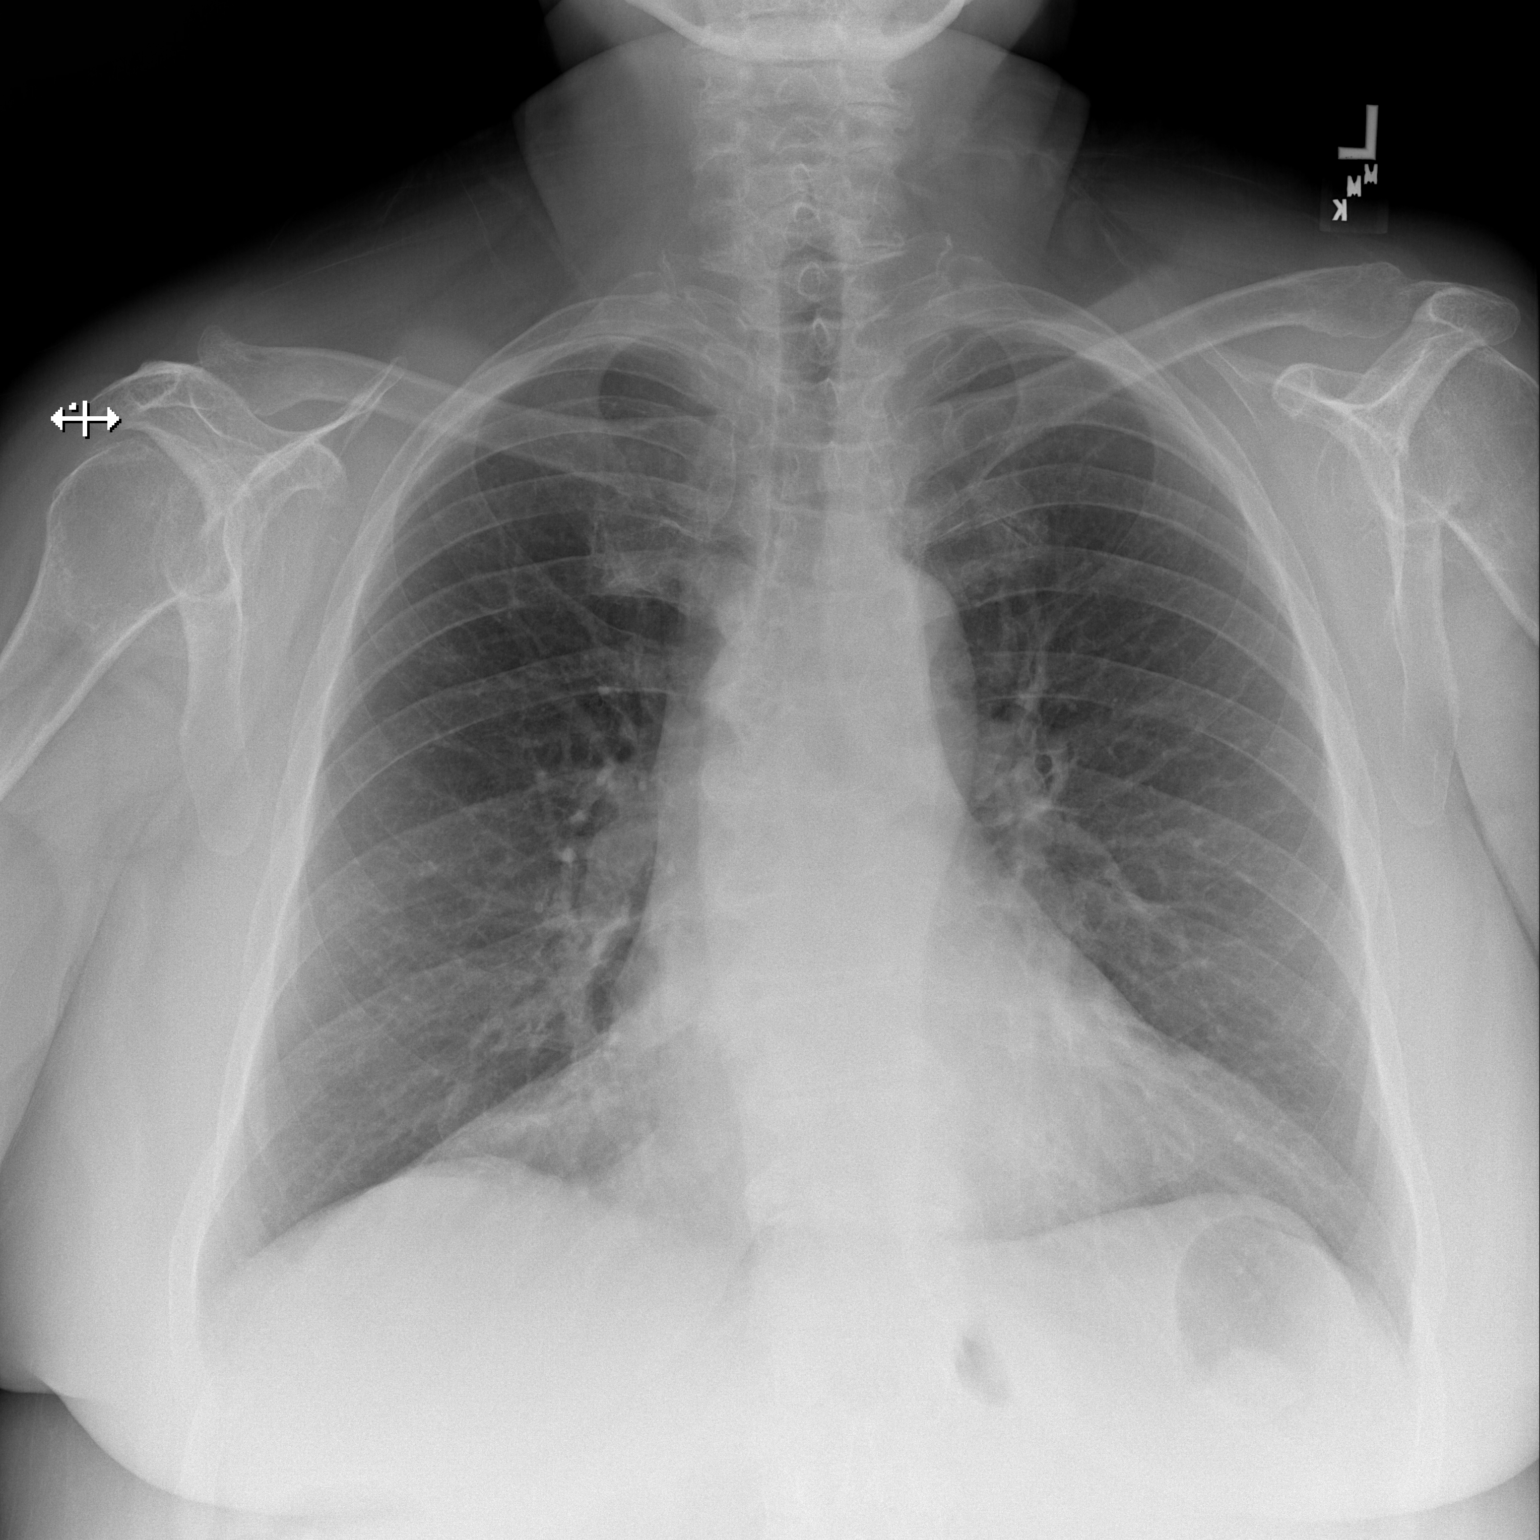

[w chest lat]
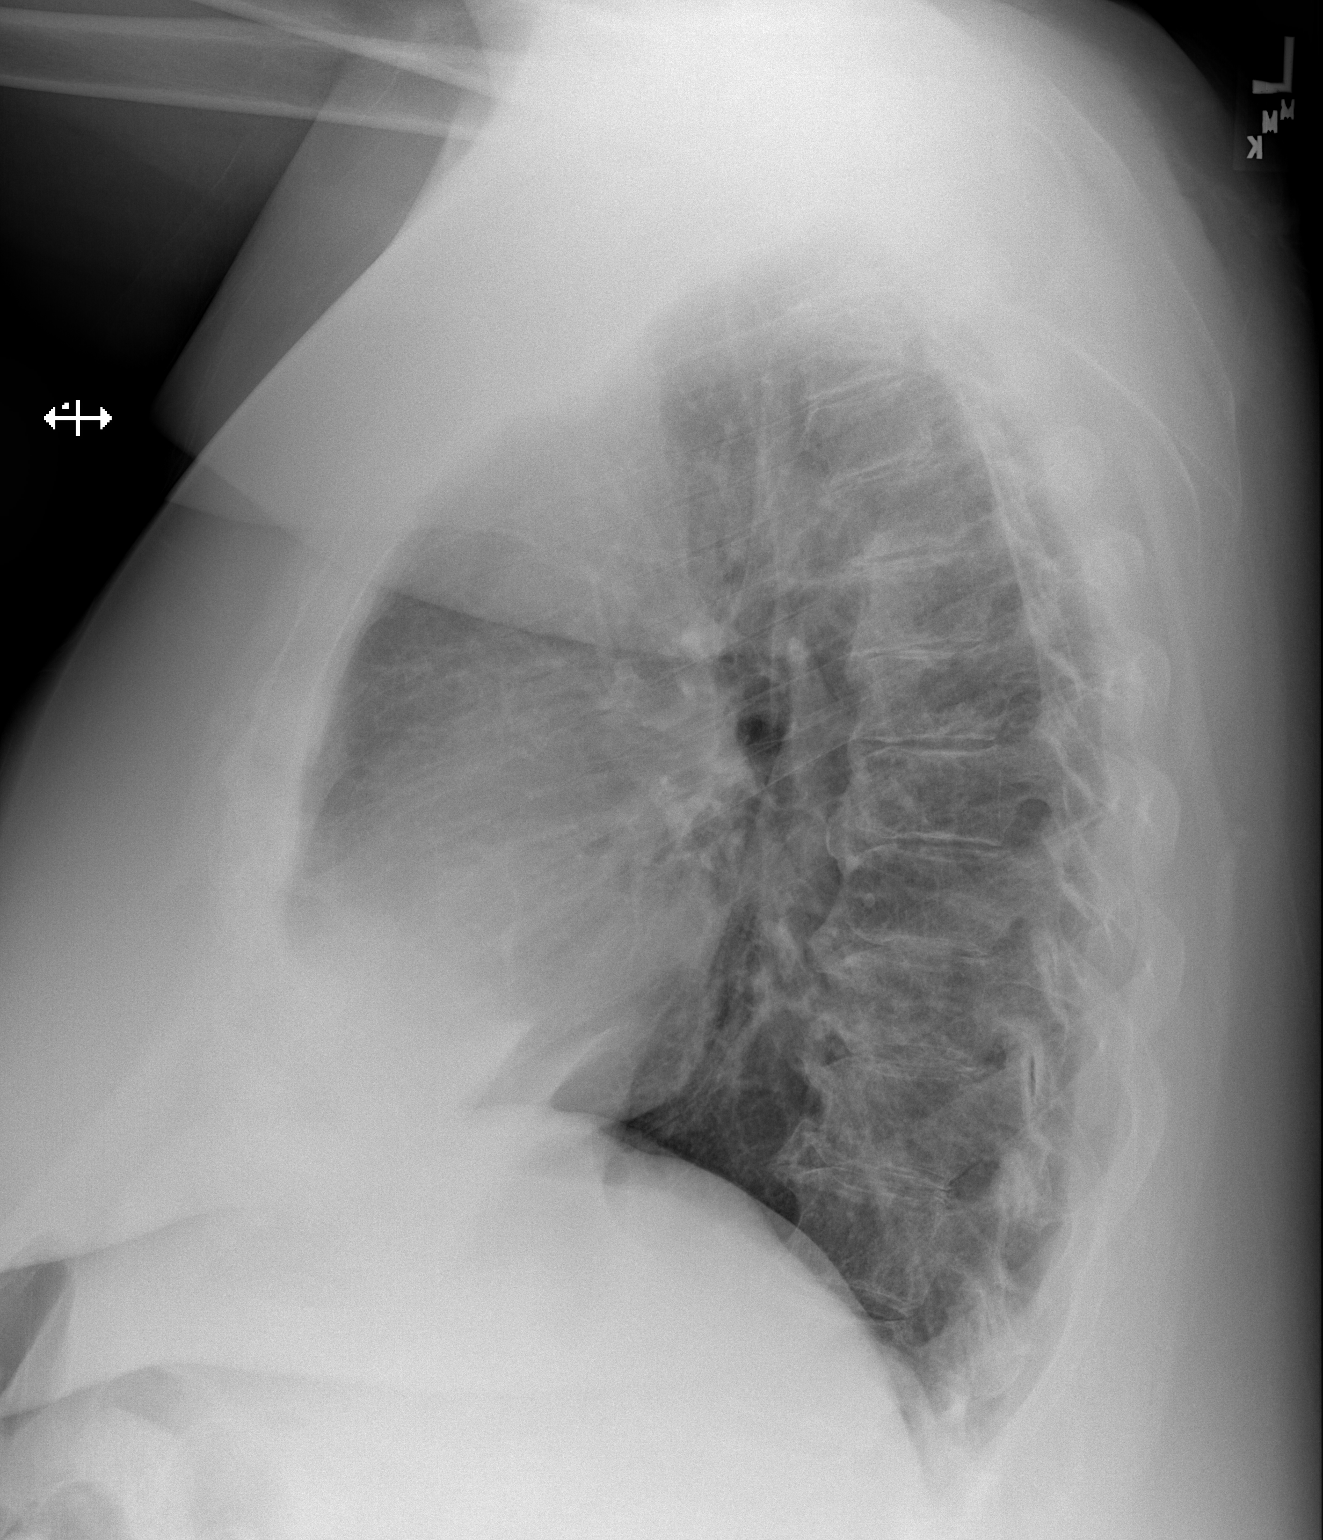

[2 of 2 positions shown; findings below may reference images not displayed]

FINDINGS: Lungs are clear. Heart size and pulmonary vascularity are normal. No
adenopathy. There is mild prominence of epicardial fat along the
right heart border. There is degenerative change in the thoracic
spine.
IMPRESSION: No edema or consolidation.

## 2019-06-19 ENCOUNTER — Encounter: Payer: Self-pay | Admitting: Emergency Medicine

## 2019-06-19 ENCOUNTER — Telehealth (INDEPENDENT_AMBULATORY_CARE_PROVIDER_SITE_OTHER): Payer: Federal, State, Local not specified - PPO | Admitting: Emergency Medicine

## 2019-06-19 ENCOUNTER — Other Ambulatory Visit: Payer: Self-pay

## 2019-06-19 DIAGNOSIS — R059 Cough, unspecified: Secondary | ICD-10-CM

## 2019-06-19 DIAGNOSIS — R0981 Nasal congestion: Secondary | ICD-10-CM | POA: Diagnosis not present

## 2019-06-19 DIAGNOSIS — J0100 Acute maxillary sinusitis, unspecified: Secondary | ICD-10-CM

## 2019-06-19 DIAGNOSIS — R05 Cough: Secondary | ICD-10-CM

## 2019-06-19 DIAGNOSIS — Z9109 Other allergy status, other than to drugs and biological substances: Secondary | ICD-10-CM | POA: Diagnosis not present

## 2019-06-19 MED ORDER — PSEUDOEPHEDRINE-GUAIFENESIN ER 60-600 MG PO TB12: 1.0000 | ORAL_TABLET | Freq: Two times a day (BID) | ORAL | 1 refills | Status: AC

## 2019-06-19 MED ORDER — BENZONATATE 200 MG PO CAPS: 200.0000 mg | ORAL_CAPSULE | Freq: Two times a day (BID) | ORAL | 0 refills | Status: DC | PRN

## 2019-06-19 MED ORDER — AZITHROMYCIN 250 MG PO TABS: ORAL_TABLET | ORAL | 0 refills | Status: DC

## 2019-06-19 NOTE — Progress Notes (Signed)
Telemedicine Encounter- SOAP NOTE Established Patient MyChart video encounter This video telephone encounter was conducted with the patient's (or proxy's) verbal consent via video audio telecommunications: yes/no: Yes Patient was instructed to have this encounter in a suitably private space; and to only have persons present to whom they give permission to participate. In addition, patient identity was confirmed by use of name plus two identifiers (DOB and address).  I discussed the limitations, risks, security and privacy concerns of performing an evaluation and management service by telephone and the availability of in person appointments. I also discussed with the patient that there may be a patient responsible charge related to this service. The patient expressed understanding and agreed to proceed.  I spent a total of TIME; 0 MIN TO 60 MIN: 15 minutes talking with the patient or their proxy.  No chief complaint on file.   Subjective   Briana Lamb is a 59 y.o. female established patient. Telephone visit today complaining of 2-week history of cough and congestion with drainage, possible sinus infection.  No other significant symptoms.  Had Covid infection last December.  HPI   Patient Active Problem List   Diagnosis Date Noted  . Primary osteoarthritis of left knee 01/08/2018    Past Medical History:  Diagnosis Date  . Anxiety   . Arthritis    "knees" (01/08/2018)  . Bee sting allergy   . Seasonal allergies     Current Outpatient Medications  Medication Sig Dispense Refill  . Ascorbic Acid (VITAMIN C) 1000 MG tablet Take 1,000 mg by mouth daily.    Marland Kitchen aspirin EC 325 MG EC tablet Take 1 tablet (325 mg total) by mouth 2 (two) times daily after a meal. 30 tablet 0  . Aspirin-Salicylamide-Caffeine (BC FAST PAIN RELIEF) 650-195-33.3 MG PACK Take 1 Package by mouth daily as needed (pain).    Marland Kitchen azithromycin (ZITHROMAX) 250 MG tablet Sig as indicated 6 tablet 0  . b  complex vitamins tablet Take 1 tablet by mouth daily.    . benzonatate (TESSALON) 200 MG capsule Take 1 capsule (200 mg total) by mouth 2 (two) times daily as needed for cough. 20 capsule 0  . bisacodyl (DULCOLAX) 5 MG EC tablet Take 1 tablet (5 mg total) by mouth daily as needed for moderate constipation. 15 tablet 0  . docusate sodium (COLACE) 100 MG capsule Take 1 capsule (100 mg total) by mouth 2 (two) times daily. 30 capsule 0  . Echinacea 125 MG CAPS Take 1 capsule by mouth daily as needed (colder months).    Marland Kitchen HYDROcodone-acetaminophen (NORCO/VICODIN) 5-325 MG tablet Take 1-2 tablets by mouth every 6 (six) hours as needed for moderate pain (pain score 4-6). 40 tablet 0  . Multiple Vitamins-Minerals (MULTIVITAMIN WITH MINERALS) tablet Take 1 tablet by mouth daily.    . pseudoephedrine-guaifenesin (MUCINEX D) 60-600 MG 12 hr tablet Take 1 tablet by mouth every 12 (twelve) hours for 5 days. 12 tablet 1   No current facility-administered medications for this visit.    Allergies  Allergen Reactions  . Penicillins Swelling and Other (See Comments)    SWELLING REACTION UNSPECIFIED  Has patient had a PCN reaction causing immediate rash, facial/tongue/throat swelling, SOB or lightheadedness with hypotension: unkn Has patient had a PCN reaction causing severe rash involving mucus membranes or skin necrosis: unkn Has patient had a PCN reaction that required hospitalization: unkn Has patient had a PCN reaction occurring within the last 10 years: unkn If all of the above  answers are "NO", then may proceed with Cephalosporin use.   . Bee Venom Swelling    SWELLING LOCALIZED    Social History   Socioeconomic History  . Marital status: Widowed    Spouse name: Not on file  . Number of children: Not on file  . Years of education: Not on file  . Highest education level: Not on file  Occupational History  . Not on file  Tobacco Use  . Smoking status: Former Smoker    Packs/day: 0.50     Years: 12.00    Pack years: 6.00    Types: Cigarettes    Quit date: 1989    Years since quitting: 32.3  . Smokeless tobacco: Never Used  Substance and Sexual Activity  . Alcohol use: Not Currently    Comment: 01/08/2018 "a couple drinks twice/month"  . Drug use: Never  . Sexual activity: Not Currently  Other Topics Concern  . Not on file  Social History Narrative  . Not on file   Social Determinants of Health   Financial Resource Strain:   . Difficulty of Paying Living Expenses:   Food Insecurity:   . Worried About Charity fundraiser in the Last Year:   . Arboriculturist in the Last Year:   Transportation Needs:   . Film/video editor (Medical):   Marland Kitchen Lack of Transportation (Non-Medical):   Physical Activity:   . Days of Exercise per Week:   . Minutes of Exercise per Session:   Stress:   . Feeling of Stress :   Social Connections:   . Frequency of Communication with Friends and Family:   . Frequency of Social Gatherings with Friends and Family:   . Attends Religious Services:   . Active Member of Clubs or Organizations:   . Attends Archivist Meetings:   Marland Kitchen Marital Status:   Intimate Partner Violence:   . Fear of Current or Ex-Partner:   . Emotionally Abused:   Marland Kitchen Physically Abused:   . Sexually Abused:     Review of Systems  Constitutional: Negative.  Negative for chills and fever.  HENT: Positive for congestion and sinus pain.   Respiratory: Positive for cough.   Cardiovascular: Negative.  Negative for chest pain and palpitations.  Gastrointestinal: Negative.  Negative for abdominal pain, diarrhea, nausea and vomiting.  Genitourinary: Negative.  Negative for dysuria and hematuria.  Musculoskeletal: Negative.  Negative for back pain, myalgias and neck pain.  Skin: Negative.  Negative for rash.  Neurological: Negative for dizziness and headaches.  All other systems reviewed and are negative.   Objective  Alert and oriented x3 in no apparent  respiratory distress. Vitals as reported by the patient: There were no vitals filed for this visit.  Diagnoses and all orders for this visit:  Acute non-recurrent maxillary sinusitis -     azithromycin (ZITHROMAX) 250 MG tablet; Sig as indicated  Sinus congestion -     pseudoephedrine-guaifenesin (MUCINEX D) 60-600 MG 12 hr tablet; Take 1 tablet by mouth every 12 (twelve) hours for 5 days.  Environmental allergies  Cough -     benzonatate (TESSALON) 200 MG capsule; Take 1 capsule (200 mg total) by mouth 2 (two) times daily as needed for cough.  Clinically stable.  No red flag signs or symptoms.  Advised to take medications as prescribed and follow-up with the office if no better in the next 1 to 2 weeks.   I discussed the assessment and treatment plan with  the patient. The patient was provided an opportunity to ask questions and all were answered. The patient agreed with the plan and demonstrated an understanding of the instructions.   The patient was advised to call back or seek an in-person evaluation if the symptoms worsen or if the condition fails to improve as anticipated.  I provided 15 minutes of non-face-to-face time during this encounter.  Georgina Quint, MD  Primary Care at [facility]

## 2019-07-11 ENCOUNTER — Encounter: Payer: Self-pay | Admitting: Family Medicine

## 2019-07-11 ENCOUNTER — Telehealth (INDEPENDENT_AMBULATORY_CARE_PROVIDER_SITE_OTHER): Payer: Federal, State, Local not specified - PPO | Admitting: Family Medicine

## 2019-07-11 VITALS — Temp 99.0°F

## 2019-07-11 DIAGNOSIS — B349 Viral infection, unspecified: Secondary | ICD-10-CM

## 2019-07-11 NOTE — Progress Notes (Signed)
Virtual Visit Note  I connected with patient on 07/11/19 at 558pm by video doximity and verified that I am speaking with the correct person using two identifiers. Briana Lamb is currently located at home and patient is currently with them during visit. The provider, Rutherford Guys, MD is located in their office at time of visit.  I discussed the limitations, risks, security and privacy concerns of performing an evaluation and management service by telephone and the availability of in person appointments. I also discussed with the patient that there may be a patient responsible charge related to this service. The patient expressed understanding and agreed to proceed.   I provided 14 minutes of non-face-to-face time during this encounter.  Chief Complaint  Patient presents with  . Fever    cough, body aches and weakness on and off all week -took alka-seltzer cold plus     HPI ? Having fevers, cough, body aches, weakness Tm 102.5 about 2 days ago Yesterday 101, today 99 Has been taking alkaselzter plus Cough - dry tickle, no nasal congestion, no sore throat, no ear pain, no swollen glands Mild nausea with vomiting, mild diarrhea Decreased appettite No abd pain, no black tarry stools, no blood in stools Denies any dysuria, but having strong odor with frequency and urgency Had sinus infection about 3 weeks ago Had covid dec 2020   Allergies  Allergen Reactions  . Penicillins Swelling and Other (See Comments)    SWELLING REACTION UNSPECIFIED  Has patient had a PCN reaction causing immediate rash, facial/tongue/throat swelling, SOB or lightheadedness with hypotension: unkn Has patient had a PCN reaction causing severe rash involving mucus membranes or skin necrosis: unkn Has patient had a PCN reaction that required hospitalization: unkn Has patient had a PCN reaction occurring within the last 10 years: unkn If all of the above answers are "NO", then may proceed with  Cephalosporin use.   . Bee Venom Swelling    SWELLING LOCALIZED    Prior to Admission medications   Medication Sig Start Date End Date Taking? Authorizing Provider  Ascorbic Acid (VITAMIN C) 1000 MG tablet Take 1,000 mg by mouth daily.   Yes [provider]  b complex vitamins tablet Take 1 tablet by mouth daily.   Yes [provider]  Chlorphen-Phenyleph-ASA (ALKA-SELTZER PLUS COLD PO) Take by mouth.   Yes [provider]  Echinacea 125 MG CAPS Take 1 capsule by mouth daily as needed (colder months).   Yes [provider]  Multiple Vitamins-Minerals (MULTIVITAMIN WITH MINERALS) tablet Take 1 tablet by mouth daily.   Yes [provider]  aspirin EC 325 MG EC tablet Take 1 tablet (325 mg total) by mouth 2 (two) times daily after a meal. Patient not taking: Reported on 07/11/2019 01/09/18   Loni Dolly, PA-C  Aspirin-Salicylamide-Caffeine (BC FAST PAIN RELIEF) [phone] MG PACK Take 1 Package by mouth daily as needed (pain).    [provider]  azithromycin (ZITHROMAX) 250 MG tablet Sig as indicated Patient not taking: Reported on 07/11/2019 06/19/19   Horald Pollen, MD    Past Medical History:  Diagnosis Date  . Anxiety   . Arthritis    "knees" (01/08/2018)  . Bee sting allergy   . Seasonal allergies     Past Surgical History:  Procedure Laterality Date  . ANKLE FRACTURE SURGERY Right 1995  . ANKLE HARDWARE REMOVAL Right ~ 1996  . CESAREAN SECTION  1999  . FRACTURE SURGERY    . JOINT  REPLACEMENT    . TOTAL KNEE ARTHROPLASTY Left 01/08/2018  . TOTAL KNEE ARTHROPLASTY Left 01/08/2018   Procedure: TOTAL KNEE ARTHROPLASTY;  Surgeon: Marcene Corning, MD;  Location: [facility] OR;  Service: Orthopedics;  Laterality: Left;  . WRIST FRACTURE SURGERY Right 04/2006    Social History   Tobacco Use  . Smoking status: Former Smoker    Packs/day: 0.50    Years: 12.00    Pack years: 6.00    Types: Cigarettes    Quit date: 1989      Years since quitting: 32.3  . Smokeless tobacco: Never Used  Substance Use Topics  . Alcohol use: Not Currently    Comment: 01/08/2018 "a couple drinks twice/month"    Family History  Problem Relation Age of Onset  . Stroke Maternal Grandfather     ROS Per hpi  Objective  Vitals as reported by the patient: per above  GEN: AAOx3, NAD HEENT: [place]/AT, pupils are symmetrical, EOMI, non-icteric sclera Resp: breathing comfortably, speaking in full sentences Skin: no rashes noted, no pallor Psych: good eye contact, normal mood and affect   ASSESSMENT and PLAN  1. Viral illness Patient with viral illness that is recovering. Discussed Fever 100.4 or higher. Patient with previous covid infection. Discussed supportive measures. Able to return to work once 24 hours afebrile. RTC precautions.   Other orders   FOLLOW-UP: CPE   The above assessment and management plan was discussed with the patient. The patient verbalized understanding of and has agreed to the management plan. Patient is aware to call the clinic if symptoms persist or worsen. Patient is aware when to return to the clinic for a follow-up visit. Patient educated on when it is appropriate to go to the emergency department.     Myles Lipps, MD Primary Care at [facility] [address] [place], [place] [address] Ph.  [phone] Fax [phone]

## 2019-07-11 NOTE — Patient Instructions (Signed)
° ° ° °  If you have lab work done today you will be contacted with your lab results within the next 2 weeks.  If you have not heard from us then please contact us. The fastest way to get your results is to register for My Chart. ° ° °IF you received an x-ray today, you will receive an invoice from [facility]. Please contact [facility] at [phone] with questions or concerns regarding your invoice.  ° °IF you received labwork today, you will receive an invoice from [facility]. Please contact [facility] at [phone] with questions or concerns regarding your invoice.  ° °Our billing staff will not be able to assist you with questions regarding bills from these companies. ° °You will be contacted with the lab results as soon as they are available. The fastest way to get your results is to activate your My Chart account. Instructions are located on the last page of this paperwork. If you have not heard from us regarding the results in 2 weeks, please contact this office. °  ° ° ° °

## 2019-09-08 ENCOUNTER — Encounter: Payer: Self-pay | Admitting: Family Medicine

## 2020-04-08 DIAGNOSIS — Z20822 Contact with and (suspected) exposure to covid-19: Secondary | ICD-10-CM | POA: Diagnosis not present

## 2021-02-08 DIAGNOSIS — B349 Viral infection, unspecified: Secondary | ICD-10-CM | POA: Diagnosis not present

## 2021-02-08 DIAGNOSIS — H6692 Otitis media, unspecified, left ear: Secondary | ICD-10-CM | POA: Diagnosis not present

## 2023-06-19 ENCOUNTER — Ambulatory Visit (INDEPENDENT_AMBULATORY_CARE_PROVIDER_SITE_OTHER): Admitting: Ophthalmology

## 2023-06-19 ENCOUNTER — Encounter (INDEPENDENT_AMBULATORY_CARE_PROVIDER_SITE_OTHER): Payer: Self-pay | Admitting: Ophthalmology

## 2023-06-19 DIAGNOSIS — H25813 Combined forms of age-related cataract, bilateral: Secondary | ICD-10-CM | POA: Diagnosis not present

## 2023-06-19 DIAGNOSIS — H35411 Lattice degeneration of retina, right eye: Secondary | ICD-10-CM | POA: Diagnosis not present

## 2023-06-19 DIAGNOSIS — H4312 Vitreous hemorrhage, left eye: Secondary | ICD-10-CM

## 2023-06-19 MED ORDER — BEVACIZUMAB CHEMO INJECTION 1.25MG/0.05ML SYRINGE FOR KALEIDOSCOPE
1.2500 mg | INTRAVITREAL | Status: AC | PRN
  Administered 2023-06-19: 1.25 mg via INTRAVITREAL

## 2023-06-19 NOTE — Progress Notes (Signed)
 [facility] - Clinic Note  06/19/2023   CHIEF COMPLAINT Patient presents for Retina Evaluation  HISTORY OF PRESENT ILLNESS: Briana Lamb is a 63 y.o. female who presents to the clinic today for:  HPI     Retina Evaluation   In left eye.  This started 5 days ago.  Associated Symptoms Flashes and Floaters.  Negative for Distortion, Blind Spot, Pain, Redness, Photophobia, Glare, Trauma, Scalp Tenderness, Jaw Claudication, Shoulder/Hip pain, Fever, Weight Loss and Fatigue.  Context:  distance vision, mid-range vision and near vision.  Treatments tried include eye drops.  Response to treatment was no improvement.  I, the attending physician,  performed the HPI with the patient and updated documentation appropriately.        Comments   Pt saw Dr. Micael Adas yesterday and was referred for a  large vitreous hemorrhage. Pt states on Friday she was standing in line and noticed some red streaks coming down in her left peripheral. Pt thought they might be floaters but then all of her vision in the left eye went away and now everything looks opaque/frosted red amber coating. Pt denies any pain or discomfort. Pt did notice a flash about 3 weeks ago but she was outside and thought nothing of it. Pt does not use any ats but she tried rinse out the eye to make sure there was nothing in it.       Last edited by Ronelle Coffee, MD on 06/19/2023  8:51 AM.    Pt is here on the referral of Dr. 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 [place],  [place] [address]  HISTORICAL INFORMATION:  Selected notes from the MEDICAL RECORD NUMBER Referred by Dr. Micael Adas for concern of vitreous hemorrhage OS LEE:  Ocular Hx- PMH-   CURRENT MEDICATIONS: No current outpatient medications on file. (Ophthalmic Drugs)   No current facility-administered medications for this visit. (Ophthalmic Drugs)   Current Outpatient Medications (Other)  Medication Sig   Ascorbic Acid (VITAMIN C) 1000 MG tablet Take 1,000 mg by mouth daily.   b complex vitamins tablet Take 1 tablet by mouth daily.   Echinacea 125 MG CAPS Take 1 capsule by mouth daily as needed (colder months).   Multiple Vitamins-Minerals (MULTIVITAMIN WITH MINERALS) tablet Take 1 tablet by mouth daily.   aspirin  EC 325 MG EC tablet Take 1 tablet (325 mg  total) by mouth 2 (two) times daily after a meal. (Patient not taking: Reported on 06/19/2023)   Aspirin -Salicylamide-Caffeine (BC FAST PAIN RELIEF) 650-195-33.3 MG PACK Take 1 Package by mouth daily as needed (pain). (Patient not taking: Reported on 06/19/2023)   azithromycin  (ZITHROMAX ) 250 MG tablet Sig as indicated (Patient not taking: Reported on 06/19/2023)   Chlorphen-Phenyleph-ASA (ALKA-SELTZER PLUS COLD PO) Take by mouth. (Patient not taking: Reported on 06/19/2023)   No current facility-administered medications for this visit. (Other)   REVIEW OF SYSTEMS: ROS    Positive for: Musculoskeletal, Eyes, Allergic/Imm Negative for: Constitutional, Gastrointestinal, Neurological, Skin, Genitourinary, HENT, Endocrine, Cardiovascular, Respiratory, Psychiatric, Heme/Lymph Last edited by Carrington Clack, COT on 06/19/2023  8:22 AM.     ALLERGIES Allergies  Allergen Reactions   Penicillins Swelling and Other (See Comments)    SWELLING REACTION UNSPECIFIED  Has patient had a PCN reaction causing immediate rash, facial/tongue/throat swelling, SOB or lightheadedness with hypotension: unkn Has patient had a PCN reaction causing severe rash involving mucus membranes or skin necrosis: unkn Has patient had a PCN reaction that required hospitalization: unkn Has patient had a PCN reaction occurring within the last 10 years: unkn If all of the above answers are "NO", then may proceed with Cephalosporin use.    Bee Venom Swelling    SWELLING LOCALIZED   PAST MEDICAL HISTORY Past Medical History:  Diagnosis Date   Anxiety    Arthritis    "knees" (01/08/2018)   Bee sting allergy    Seasonal allergies    Past Surgical History:  Procedure Laterality Date   ANKLE FRACTURE SURGERY Right 1995   ANKLE HARDWARE REMOVAL Right ~ 1996   CESAREAN SECTION  1999   FRACTURE SURGERY     JOINT REPLACEMENT     TOTAL KNEE ARTHROPLASTY Left 01/08/2018   TOTAL KNEE ARTHROPLASTY Left 01/08/2018   Procedure: TOTAL KNEE ARTHROPLASTY;  Surgeon: Dayne Even, MD;  Location: [facility] OR;  Service: Orthopedics;  Laterality: Left;   WRIST FRACTURE SURGERY Right 04/2006   FAMILY HISTORY Family History  Problem Relation Age of Onset   Stroke Maternal Grandfather    SOCIAL HISTORY Social History   Tobacco Use   Smoking status: Former    Current packs/day: 0.00    Average packs/day: 0.5 packs/day for 12.0 years (6.0 ttl pk-yrs)    Types: Cigarettes    Start date: 55    Quit date: 1989    Years since quitting: 36.3   Smokeless tobacco: Never  Vaping Use   Vaping status:  Never Used  Substance Use Topics   Alcohol use: Not Currently    Comment: 01/08/2018 "a couple drinks twice/month"   Drug use: Never       OPHTHALMIC EXAM:  Base Eye Exam     Visual Acuity (Snellen - Linear)       Right Left   Dist cc 20/20 HM   Dist ph cc  NI    Correction: Glasses         Tonometry (Tonopen, 8:31 AM)       Right Left   Pressure 20 20         Pupils       Dark Light Shape React APD   Right 3 2 Round Brisk None   Left 3 2 Round Brisk None         Visual Fields       Left Right    Full Full         Extraocular  Movement       Right Left    Full, Ortho Full, Ortho         Neuro/Psych     Oriented x3: Yes         Dilation     Both eyes: 1.0% Mydriacyl, 2.5% Phenylephrine  @ 8:32 AM           Slit Lamp and Fundus Exam     Slit Lamp Exam       Right Left   Lids/Lashes Dermatochalasis - upper lid Dermatochalasis - upper lid   Conjunctiva/Sclera Small nasal pingeucula temporal pinguecula   Cornea trace PEE trace PEE   Anterior Chamber deep, clear, narrow temporal angle deep, clear, narrow temporal angle   Iris Round and dilated, No NVI Round and dilated, No NVI   Lens 2+ Nuclear sclerosis, 2+ Cortical cataract 2+ Nuclear sclerosis, 2+ Cortical cataract   Anterior Vitreous syneresis, vitreous condensations syneresis, diffuse RBCs         Fundus Exam       Right Left   Disc Pink and Sharp, temporal PPA no view   C/D Ratio 0.3    Macula Flat, Good foveal reflex, RPE mottling, No heme or edema no view   Vessels attenuated, Tortuous no view   Periphery Attached, pigmented lattice at 0630 no view           Refraction     Wearing Rx       Sphere Cylinder Axis Add   Right -3.00 +1.75 035 +2.50   Left -0.75 +0.75 088 +2.50    Age: 53 years   Type: Progressive           IMAGING AND PROCEDURES  Imaging and Procedures for 06/19/2023  OCT, Retina - OU - Both Eyes       Right Eye Quality was good.  Central Foveal Thickness: 314. Progression has no prior data. Findings include normal foveal contour, no IRF, no SRF.   Left Eye Findings include (No images obtained).   Notes *Images captured and stored on drive  Diagnosis / Impression:  OD: NFP, no IRF/SRF OS: no images obtained  Clinical management:  See below  Abbreviations: NFP - Normal foveal profile. CME - cystoid macular edema. PED - pigment epithelial detachment. IRF - intraretinal fluid. SRF - subretinal fluid. EZ - ellipsoid zone. ERM - epiretinal membrane. ORA - outer retinal atrophy. ORT - outer retinal tubulation. SRHM - subretinal hyper-reflective material. IRHM - intraretinal hyper-reflective material      Intravitreal Injection, Pharmacologic Agent - OS - Left Eye       Time Out 06/19/2023. 9:30 AM. Confirmed correct patient, procedure, site, and patient consented.   Anesthesia Topical anesthesia was used. Anesthetic medications included Lidocaine 2%, Proparacaine 0.5%.   Procedure Preparation included 5% betadine to ocular surface, eyelid speculum. A supplied needle was used.   Injection: 1.25 mg Bevacizumab  1.25mg /0.35ml   Route: Intravitreal, Site: Left Eye   NDC: [id number], Lot: [id number], Expiration date: 07/14/2023   Post-op Post injection exam found visual acuity of at least counting fingers. The patient tolerated the procedure well. There were no complications. The patient received written and verbal post procedure care education.      B-Scan Ultrasound - OS - Left Eye       Quality was good. Findings included vitreous hemorrhage, vitreous opacities.   Notes **Images stored on drive**  Impression: OS: vitreous opacities consistent with hemorrhage; no obvious RT/RD or mass  ASSESSMENT/PLAN:   ICD-10-CM   1. Vitreous hemorrhage of left eye (HCC)  H43.12 OCT, Retina - OU - Both Eyes    Intravitreal Injection, Pharmacologic Agent - OS - Left Eye    B-Scan Ultrasound - OS - Left  Eye    Bevacizumab  (AVASTIN ) SOLN 1.25 mg    2. 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 Combined forms of age-related cataract of both eyes  H25.813      Vitreous hemorrhage OS  - onset Friday, 04.18.25 while vacationing in [place]  - BCVA OS HM  - exam shows diffuse VH  - unclear etiology -- no history of trauma; no history of DM or HTN, but pt has not seen a PCP in years; no blood thinner use; possible hemorrhagic PVD?  - discussed findings, thoughts, treatment options  - recommend IVA OS #1 today, 04.22.25  - pt wishes to proceed with injection  - RBA of procedure discussed, questions answered - IVA informed consent obtained and signed, 04.22.25 - see procedure note - VH precautions reviewed -- minimize activities, keep head elevated, avoid ASA/NSAIDs/blood thinners as able - f/u 1 week -- DFE, OCT  2. Lattice degeneration right eye - small patch of pigmented lattice at 0630 - asymptomatic - discussed findings, prognosis, and treatment options including observation - recommend laser retinopexy OD -- but will have to bring pt back once laser comes in  3. Mixed Cataract OU - The symptoms of cataract, surgical options, and treatments and risks were discussed with patient. - discussed diagnosis and progression - monitor   Ophthalmic Meds Ordered this visit:  Meds ordered this encounter  Medications   Bevacizumab  (AVASTIN ) SOLN 1.25 mg     Return in about 1 week (around 06/26/2023) for f/u VH OS, DFE, OCT.  There are no Patient Instructions on file for this visit.  Explained the diagnoses, plan, and follow up with the patient and they expressed understanding.  Patient expressed understanding of the importance of proper follow up care.   This document serves as a record of services personally performed by Jeanice Millard, MD, PhD. It was created on their behalf by Morley Arabia. Bevin Bucks, OA an ophthalmic technician. The creation of this record is the provider's  dictation and/or activities during the visit.    Electronically signed by: Morley Arabia. Bevin Bucks, OA 06/19/23 4:36 PM  Jeanice Millard, M.D., Ph.D. Diseases & Surgery of the Retina and Vitreous [facility] 06/19/2023  I have reviewed the above documentation for accuracy and completeness, and I agree with the above. Jeanice Millard, M.D., Ph.D. 06/19/23 4:36 PM   Abbreviations: M myopia (nearsighted); A astigmatism; H hyperopia (farsighted); P presbyopia; Mrx spectacle prescription;  CTL contact lenses; OD right eye; OS left eye; OU both eyes  XT exotropia; ET esotropia; PEK punctate epithelial keratitis; PEE punctate epithelial erosions; DES dry eye syndrome; MGD meibomian gland dysfunction; ATs artificial tears; PFAT's preservative free artificial tears; NSC nuclear sclerotic cataract; PSC posterior subcapsular cataract; ERM epi-retinal membrane; PVD posterior vitreous detachment; RD retinal detachment; DM diabetes mellitus; DR diabetic retinopathy; NPDR non-proliferative diabetic retinopathy; PDR proliferative diabetic retinopathy; CSME clinically significant macular edema; DME diabetic macular edema; dbh dot blot hemorrhages; CWS cotton wool spot; POAG primary open angle glaucoma; C/D cup-to-disc ratio; HVF humphrey visual field; GVF goldmann visual field; OCT optical coherence tomography; IOP intraocular pressure; BRVO Branch retinal vein occlusion; CRVO central retinal vein occlusion; CRAO central retinal artery occlusion;  BRAO branch retinal artery occlusion; RT retinal tear; SB scleral buckle; PPV pars plana vitrectomy; VH Vitreous hemorrhage; PRP panretinal laser photocoagulation; IVK intravitreal kenalog; VMT vitreomacular traction; MH Macular hole;  NVD neovascularization of the disc; NVE neovascularization elsewhere; AREDS age related eye disease study; ARMD age related macular degeneration; POAG primary open angle glaucoma; EBMD epithelial/anterior basement membrane dystrophy;  ACIOL anterior chamber intraocular lens; IOL intraocular lens; PCIOL posterior chamber intraocular lens; Phaco/IOL phacoemulsification with intraocular lens placement; PRK photorefractive keratectomy; LASIK laser assisted in situ keratomileusis; HTN hypertension; DM diabetes mellitus; COPD chronic obstructive pulmonary disease

## 2023-06-21 NOTE — Progress Notes (Signed)
 [facility] - Clinic Note  06/26/2023   CHIEF COMPLAINT Patient presents for Retina Follow Up  HISTORY OF PRESENT ILLNESS: Briana Lamb is a 63 y.o. female who presents to the clinic today for:  HPI     Retina Follow Up   Patient presents with  Other.  In left eye.  This started 1 week ago.  I, the attending physician,  performed the HPI with the patient and updated documentation appropriately.        Comments   Patient feels the vision may be clearing a little bit. She is still seeing a shadow. She is using the eye wash from last weeks injection.       Last edited by Aziel Morgan, MD on 06/26/2023 12:49 PM.    Patient states that she is trying hard to keep up the positioning at night.   Referring physician: [name], [name], OD [address] [place],  [place] [address]  HISTORICAL INFORMATION:  Selected notes from the MEDICAL RECORD NUMBER Referred by Dr. Micael Adas for concern of vitreous hemorrhage OS LEE:  Ocular Hx- PMH-   CURRENT MEDICATIONS: No current outpatient medications on file. (Ophthalmic Drugs)   No current facility-administered medications for this visit. (Ophthalmic Drugs)   Current Outpatient Medications (Other)  Medication Sig   Ascorbic Acid (VITAMIN C) 1000 MG tablet Take 1,000 mg by mouth daily.   aspirin  EC 325 MG EC tablet Take 1 tablet (325 mg total) by mouth 2 (two) times daily after a meal. (Patient not taking: Reported on 06/19/2023)   Aspirin -Salicylamide-Caffeine (BC FAST PAIN RELIEF) 650-195-33.3 MG PACK Take 1 Package by mouth daily as needed (pain). (Patient not taking: Reported on 06/19/2023)   azithromycin  (ZITHROMAX ) 250 MG tablet Sig as indicated (Patient not taking: Reported on 06/19/2023)   b complex vitamins tablet Take 1 tablet by mouth daily.   Chlorphen-Phenyleph-ASA (ALKA-SELTZER PLUS COLD PO) Take by mouth. (Patient not taking: Reported on 06/19/2023)   Echinacea 125 MG CAPS Take 1 capsule by  mouth daily as needed (colder months).   Multiple Vitamins-Minerals (MULTIVITAMIN WITH MINERALS) tablet Take 1 tablet by mouth daily.   No current facility-administered medications for this visit. (Other)   REVIEW OF SYSTEMS: ROS   Positive for: Musculoskeletal, Eyes, Allergic/Imm Negative for: Constitutional, Gastrointestinal, Neurological, Skin, Genitourinary, HENT, Endocrine, Cardiovascular, Respiratory, Psychiatric, Heme/Lymph Last edited by Olene Berne, COT on 06/26/2023  9:15 AM.     ALLERGIES Allergies  Allergen Reactions   Penicillins Swelling and Other (See Comments)    SWELLING REACTION UNSPECIFIED  Has patient had a PCN reaction causing immediate rash, facial/tongue/throat swelling, SOB or lightheadedness with hypotension: unkn Has patient had a PCN reaction causing severe rash involving mucus membranes or skin necrosis: unkn Has patient had a PCN reaction that required hospitalization: unkn Has patient had a PCN reaction occurring within the last 10 years: unkn If all of the above answers are "NO", then may proceed with Cephalosporin use.    Bee Venom Swelling    SWELLING LOCALIZED   PAST MEDICAL HISTORY Past Medical History:  Diagnosis Date   Anxiety    Arthritis    "knees" (01/08/2018)   Bee sting allergy    Seasonal allergies    Past Surgical History:  Procedure Laterality Date   ANKLE FRACTURE SURGERY Right 1995   ANKLE HARDWARE REMOVAL Right ~ 1996   CESAREAN SECTION  1999   FRACTURE SURGERY     JOINT REPLACEMENT  TOTAL KNEE ARTHROPLASTY Left 01/08/2018   TOTAL KNEE ARTHROPLASTY Left 01/08/2018   Procedure: TOTAL KNEE ARTHROPLASTY;  Surgeon: Dayne Even, MD;  Location: [facility] OR;  Service: Orthopedics;  Laterality: Left;   WRIST FRACTURE SURGERY Right 04/2006   FAMILY HISTORY Family History  Problem Relation Age of Onset   Stroke Maternal Grandfather    SOCIAL HISTORY Social History   Tobacco Use   Smoking status: Former    Current  packs/day: 0.00    Average packs/day: 0.5 packs/day for 12.0 years (6.0 ttl pk-yrs)    Types: Cigarettes    Start date: 10    Quit date: 1989    Years since quitting: 36.3   Smokeless tobacco: Never  Vaping Use   Vaping status: Never Used  Substance Use Topics   Alcohol use: Not Currently    Comment: 01/08/2018 "a couple drinks twice/month"   Drug use: Never       OPHTHALMIC EXAM:  Base Eye Exam     Visual Acuity (Snellen - Linear)       Right Left   Dist cc 20/15 CF at face   Dist ph cc  NI    Correction: Glasses         Tonometry (Tonopen, 9:18 AM)       Right Left   Pressure 13 17         Pupils       Dark Light Shape React APD   Right 3 2 Round Brisk None   Left 3 2 Round Brisk None         Visual Fields       Left Right    Full Full         Extraocular Movement       Right Left    Full, Ortho Full, Ortho         Neuro/Psych     Oriented x3: Yes         Dilation     Both eyes: 1.0% Mydriacyl , 2.5% Phenylephrine  @ 9:16 AM           Slit Lamp and Fundus Exam     Slit Lamp Exam       Right Left   Lids/Lashes Dermatochalasis - upper lid Dermatochalasis - upper lid   Conjunctiva/Sclera Small nasal pingeucula temporal pinguecula   Cornea trace PEE trace PEE   Anterior Chamber deep, clear, narrow temporal angle deep, clear, narrow temporal angle   Iris Round and dilated, No NVI Round and dilated, No NVI   Lens 2+ Nuclear sclerosis, 2+ Cortical cataract 2+ Nuclear sclerosis, 2+ Cortical cataract   Anterior Vitreous syneresis, vitreous condensations syneresis, diffuse RBCs, Vitreous condensations         Fundus Exam       Right Left   Disc Pink and Sharp, temporal PPA no view   C/D Ratio 0.3    Macula Flat, Good foveal reflex, RPE mottling, No heme or edema no view   Vessels attenuated, Tortuous no view   Periphery Attached, pigmented lattice at 0630 very hazy view           Refraction     Wearing Rx        Sphere Cylinder Axis Add   Right -3.00 +1.75 035 +2.50   Left -0.75 +0.75 088 +2.50    Type: Progressive           IMAGING AND PROCEDURES  Imaging and Procedures for 06/26/2023  OCT, Retina - OU -  Both Eyes       Right Eye Quality was good. Central Foveal Thickness: 298. Progression has been stable. Findings include normal foveal contour, no IRF, no SRF.   Left Eye Findings include (No images obtained).   Notes *Images captured and stored on drive  Diagnosis / Impression:  OD: NFP, no IRF/SRF OS: no images obtained  Clinical management:  See below  Abbreviations: NFP - Normal foveal profile. CME - cystoid macular edema. PED - pigment epithelial detachment. IRF - intraretinal fluid. SRF - subretinal fluid. EZ - ellipsoid zone. ERM - epiretinal membrane. ORA - outer retinal atrophy. ORT - outer retinal tubulation. SRHM - subretinal hyper-reflective material. IRHM - intraretinal hyper-reflective material           ASSESSMENT/PLAN:   ICD-10-CM   1. Vitreous hemorrhage of left eye (HCC)  H43.12 OCT, Retina - OU - Both Eyes    2. 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 Combined forms of age-related cataract of both eyes  H25.813      Vitreous hemorrhage OS  - onset Friday, 04.18.25 while vacationing in [place]  - s/p IVA OS #1 (04.22.25)  - BCVA OS CF @ face from [facility]  - exam shows diffuse VH -- persistent - unclear etiology -- no history of trauma; no history of DM or HTN, but pt has not seen a PCP in years; no blood thinner use; possible hemorrhagic PVD? - discussed findings, thoughts, treatment options- another shot in three weeks, wait and continue elevation of head, and/or surgery  - pt wishes to continue waiting and another injection in three weeks  - RBA of procedure discussed, questions answered - IVA informed consent obtained and signed, 04.22.25 - see procedure note - VH precautions reviewed -- minimize activities, keep head elevated,  avoid ASA/NSAIDs/blood thinners as able - f/u 1-2 weeks - DFE/OCT  2. Lattice degeneration right eye - small patch of pigmented lattice at 0630 - asymptomatic - discussed findings, prognosis, and treatment options including observation - recommend laser retinopexy OD -- but will have to schedule procedure once laser comes in  3. Mixed Cataract OU - The symptoms of cataract, surgical options, and treatments and risks were discussed with patient. - discussed diagnosis and progression - monitor  Ophthalmic Meds Ordered this visit:  No orders of the defined types were placed in this encounter.    Return in about 2 weeks (around 07/10/2023) for f/u VH OS, DFE, OCT.  There are no Patient Instructions on file for this visit.  Explained the diagnoses, plan, and follow up with the patient and they expressed understanding.  Patient expressed understanding of the importance of proper follow up care.   This document serves as a record of services personally performed by Jeanice Millard, MD, PhD. It was created on their behalf by Olene Berne, COT an ophthalmic technician. The creation of this record is the provider's dictation and/or activities during the visit.    Electronically signed by:  Olene Berne, COT  06/26/23 12:50 PM  Jeanice Millard, M.D., Ph.D. Diseases & Surgery of the Retina and Vitreous [facility] 06/26/2023  I have reviewed the above documentation for accuracy and completeness, and I agree with the above. Jeanice Millard, M.D., Ph.D. 06/26/23 12:53 PM   Abbreviations: M myopia (nearsighted); A astigmatism; H hyperopia (farsighted); P presbyopia; Mrx spectacle prescription;  CTL contact lenses; OD right eye; OS left eye; OU both eyes  XT exotropia; ET esotropia; PEK punctate epithelial keratitis; PEE punctate epithelial erosions; DES dry eye syndrome; MGD meibomian gland dysfunction; ATs artificial tears; PFAT's preservative free artificial  tears; NSC nuclear sclerotic cataract; PSC posterior subcapsular cataract; ERM epi-retinal membrane; PVD posterior vitreous detachment; RD retinal detachment; DM diabetes mellitus; DR diabetic retinopathy; NPDR non-proliferative diabetic retinopathy; PDR proliferative diabetic retinopathy; CSME clinically significant macular edema; DME diabetic macular edema; dbh dot blot hemorrhages; CWS cotton wool spot; POAG primary open angle glaucoma; C/D cup-to-disc ratio; HVF humphrey visual field; GVF goldmann visual field; OCT optical coherence tomography; IOP intraocular pressure; BRVO Branch retinal vein occlusion; CRVO central retinal vein occlusion; CRAO central retinal artery occlusion; BRAO branch retinal artery occlusion; RT retinal tear; SB scleral buckle; PPV pars plana vitrectomy; VH Vitreous hemorrhage; PRP panretinal laser photocoagulation; IVK intravitreal kenalog ; VMT vitreomacular traction; MH Macular hole;  NVD neovascularization of the disc; NVE neovascularization elsewhere; AREDS age related eye disease study; ARMD age related macular degeneration; POAG primary open angle glaucoma; EBMD epithelial/anterior basement membrane dystrophy; ACIOL anterior chamber intraocular lens; IOL intraocular lens; PCIOL posterior chamber intraocular lens; Phaco/IOL phacoemulsification with intraocular lens placement; PRK photorefractive keratectomy; LASIK laser assisted in situ keratomileusis; HTN hypertension; DM diabetes mellitus; COPD chronic obstructive pulmonary disease

## 2023-06-26 ENCOUNTER — Ambulatory Visit (INDEPENDENT_AMBULATORY_CARE_PROVIDER_SITE_OTHER): Admitting: Ophthalmology

## 2023-06-26 ENCOUNTER — Encounter (INDEPENDENT_AMBULATORY_CARE_PROVIDER_SITE_OTHER): Payer: Self-pay | Admitting: Ophthalmology

## 2023-06-26 DIAGNOSIS — H35411 Lattice degeneration of retina, right eye: Secondary | ICD-10-CM | POA: Diagnosis not present

## 2023-06-26 DIAGNOSIS — H4312 Vitreous hemorrhage, left eye: Secondary | ICD-10-CM | POA: Diagnosis not present

## 2023-06-26 DIAGNOSIS — H25813 Combined forms of age-related cataract, bilateral: Secondary | ICD-10-CM | POA: Diagnosis not present

## 2023-06-27 NOTE — Progress Notes (Signed)
 [facility] - Clinic Note  07/10/2023   CHIEF COMPLAINT Patient presents for Retina Follow Up  HISTORY OF PRESENT ILLNESS: Briana Lamb is a 63 y.o. female who presents to the clinic today for:  HPI     Retina Follow Up   Patient presents with  Other.  In left eye.  This started 3 weeks ago.  Duration of 2 weeks.  Since onset it is gradually worsening.  I, the attending physician,  performed the HPI with the patient and updated documentation appropriately.        Comments   Pt presents for retina follow up, VH OS. Pt states vision seems more blurred. Pt is seeing a labyrinth in her eye and it looks opaque behind it. Pt states she is noticing little flashes of light in the left eye when lying at an incline. Pt states when she was seeing more clearly she could notice floaters but she doesn't see them anymore. Pt states she feels like she has some pressure behind the left eye, typically toward the end of the day.      Last edited by Ronelle Coffee, MD on 07/10/2023 12:20 PM.    Patient feels like her vision is worse today, she states her vision looks like a kaleidescope, she saw a few fol over the weekend  Referring physician: [name], [name], OD [address] [place] [place],  [place] [address]  HISTORICAL INFORMATION:  Selected notes from the MEDICAL RECORD NUMBER Referred by Dr. Micael Adas for concern of vitreous hemorrhage OS LEE:  Ocular Hx- PMH-   CURRENT MEDICATIONS: No current outpatient medications on file. (Ophthalmic Drugs)   No current facility-administered medications for this visit. (Ophthalmic Drugs)   Current Outpatient Medications (Other)  Medication Sig   Ascorbic Acid (VITAMIN C) 1000 MG tablet Take 1,000 mg by mouth daily.   b complex vitamins tablet Take 1 tablet by mouth daily.   Echinacea 125 MG CAPS Take 1 capsule by mouth daily as needed (colder months).   Multiple Vitamins-Minerals (MULTIVITAMIN WITH MINERALS) tablet Take 1  tablet by mouth daily.   aspirin  EC 325 MG EC tablet Take 1 tablet (325 mg total) by mouth 2 (two) times daily after a meal. (Patient not taking: Reported on 07/10/2023)   Aspirin -Salicylamide-Caffeine (BC FAST PAIN RELIEF) 650-195-33.3 MG PACK Take 1 Package by mouth daily as needed (pain). (Patient not taking: Reported on 06/19/2023)   azithromycin  (ZITHROMAX ) 250 MG tablet Sig as indicated (Patient not taking: Reported on 07/10/2023)   Chlorphen-Phenyleph-ASA (ALKA-SELTZER PLUS COLD PO) Take by mouth. (Patient not taking: Reported on 07/10/2023)   No current facility-administered medications for this visit. (Other)   REVIEW OF SYSTEMS: ROS   Positive for: Musculoskeletal, Eyes, Allergic/Imm Negative for: Constitutional, Gastrointestinal, Neurological, Skin, Genitourinary, HENT, Endocrine, Cardiovascular, Respiratory, Psychiatric, Heme/Lymph Last edited by Carrington Clack, COT on 07/10/2023 10:03 AM.     ALLERGIES Allergies  Allergen Reactions   Penicillins Swelling and Other (See Comments)    SWELLING REACTION UNSPECIFIED  Has patient had a PCN reaction causing immediate rash, facial/tongue/throat swelling, SOB or lightheadedness with hypotension: unkn Has patient had a PCN reaction causing severe rash involving mucus membranes or skin necrosis: unkn Has patient had a PCN reaction that required hospitalization: unkn Has patient had a PCN reaction occurring within the last 10 years: unkn If all of the above answers are "NO", then may proceed with Cephalosporin use.    Bee Venom Swelling    SWELLING LOCALIZED  PAST MEDICAL HISTORY Past Medical History:  Diagnosis Date   Anxiety    Arthritis    "knees" (01/08/2018)   Bee sting allergy    Seasonal allergies    Past Surgical History:  Procedure Laterality Date   ANKLE FRACTURE SURGERY Right 1995   ANKLE HARDWARE REMOVAL Right ~ 1996   CESAREAN SECTION  1999   FRACTURE SURGERY     JOINT REPLACEMENT     TOTAL KNEE ARTHROPLASTY  Left 01/08/2018   TOTAL KNEE ARTHROPLASTY Left 01/08/2018   Procedure: TOTAL KNEE ARTHROPLASTY;  Surgeon: Dayne Even, MD;  Location: [facility] OR;  Service: Orthopedics;  Laterality: Left;   WRIST FRACTURE SURGERY Right 04/2006   FAMILY HISTORY Family History  Problem Relation Age of Onset   Stroke Maternal Grandfather    SOCIAL HISTORY Social History   Tobacco Use   Smoking status: Former    Current packs/day: 0.00    Average packs/day: 0.5 packs/day for 12.0 years (6.0 ttl pk-yrs)    Types: Cigarettes    Start date: 61    Quit date: 1989    Years since quitting: 36.3   Smokeless tobacco: Never  Vaping Use   Vaping status: Never Used  Substance Use Topics   Alcohol use: Not Currently    Comment: 01/08/2018 "a couple drinks twice/month"   Drug use: Never       OPHTHALMIC EXAM:  Base Eye Exam     Visual Acuity (Snellen - Linear)       Right Left   Dist [place] 20/20 CF @ face   Dist ph [place] NI NI    Correction: Glasses         Tonometry (Tonopen, 10:19 AM)       Right Left   Pressure 15 15         Pupils       Pupils Dark Light Shape React APD   Right PERRL 3 2 Round Brisk None   Left PERRL 3 2 Round Brisk None         Visual Fields       Left Right    Full Full         Extraocular Movement       Right Left    Full, Ortho Full, Ortho         Neuro/Psych     Oriented x3: Yes         Dilation     Both eyes: 1.0% Mydriacyl, 2.5% Phenylephrine  @ 10:20 AM           Slit Lamp and Fundus Exam     Slit Lamp Exam       Right Left   Lids/Lashes Dermatochalasis - upper lid Dermatochalasis - upper lid   Conjunctiva/Sclera Small nasal pingeucula temporal pinguecula   Cornea trace PEE mild arcus, trace PEE   Anterior Chamber deep, clear, narrow temporal angle deep, clear, narrow temporal angle   Iris Round and dilated, No NVI Round and dilated, No NVI   Lens 2+ Nuclear sclerosis, 2+ Cortical cataract 2+ Nuclear sclerosis, 2+ Cortical  cataract   Anterior Vitreous syneresis, vitreous condensations syneresis, diffuse VH, ?turning ochre, diffuse RBCs, Vitreous condensations, white blood clots settled inferiorly         Fundus Exam       Right Left   Disc Pink and Sharp, temporal PPA no view   C/D Ratio 0.3    Macula Flat, Good foveal reflex, RPE mottling, No heme or edema no  view   Vessels attenuated, Tortuous no view   Periphery Attached, pigmented lattice at 0630 very hazy view           Refraction     Wearing Rx       Sphere Cylinder Axis Add   Right -3.00 +1.75 035 +2.50   Left -0.75 +0.75 088 +2.50    Type: Progressive           IMAGING AND PROCEDURES  Imaging and Procedures for 07/10/2023  OCT, Retina - OU - Both Eyes        Right Eye Quality was good. Central Foveal Thickness: 297. Progression has been stable. Findings include normal foveal contour, no IRF, no SRF.   Left Eye Findings include (No images obtained).   Notes  *Images captured and stored on drive  Diagnosis / Impression:  OD: NFP, no IRF/SRF OS: no images obtained  Clinical management:  See below  Abbreviations: NFP - Normal foveal profile. CME - cystoid macular edema. PED - pigment epithelial detachment. IRF - intraretinal fluid. SRF - subretinal fluid. EZ - ellipsoid zone. ERM - epiretinal membrane. ORA - outer retinal atrophy. ORT - outer retinal tubulation. SRHM - subretinal hyper-reflective material. IRHM - intraretinal hyper-reflective material      B-Scan Ultrasound - OS - Left Eye        Quality was good. Findings included extensive retinal detachment, vitreous hemorrhage, vitreous opacities.   Notes  **Images stored on drive**  Impression: OS: vitreous opacities consistent with hemorrhage; +retinal detachment            ASSESSMENT/PLAN:   ICD-10-CM   1. Vitreous hemorrhage of left eye (HCC)  H43.12 OCT, Retina - OU - Both Eyes    B-Scan Ultrasound - OS - Left Eye    2. Left retinal  detachment  H33.22 B-Scan Ultrasound - OS - Left Eye    3. 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 Vitreous hemorrhage and retinal detachment OS  - onset Friday, 04.18.25 while vacationing in [place]  - s/p IVA OS #1 (04.22.25)  - BCVA OS CF @ face - stable, no improvement  - exam shows diffuse VH -- persistent - B-scan ultrasound today (05.13.25) shows total RD behind diffuse VH - The incidence, risk factors, and natural history of retinal detachment was discussed with patient.   - Potential treatment options including delimiting laser, pneumatic retinopexy, scleral buckle, and vitrectomy, cryotherapy and laser, and the use of air, gas, and oil discussed with patient. - The risks of blindness, loss of vision, infection, hemorrhage, cataract progression or lens displacement were discussed with patient. - recommend SBP + 25g PPV/EL/Gas vs silicon oil OS under general anesthesia - pt wishes to proceed with surgery - RBA of procedure discussed, questions answered - informed consent obtained and signed - case scheduled for Thursday, Jul 19, 2023, 1:30p, [facility] OR 8 - f/u POD1 - IVA informed consent obtained and signed, 04.22.25 - VH precautions reviewed -- minimize activities, keep head elevated, avoid ASA/NSAIDs/blood thinners as able  3. Lattice degeneration right eye - small patch of pigmented lattice at 0630 - asymptomatic - discussed findings, prognosis, and treatment options including observation -  recommend laser retinopexy OD -- but will have to schedule procedure once laser comes in  4. Mixed Cataract OU - The symptoms of cataract, surgical options, and treatments and risks were  discussed with patient. - discussed diagnosis and progression - monitor  Ophthalmic Meds Ordered this visit:  No orders of the defined types were placed in this encounter.    Return in about 10 days (around 07/20/2023) for f/u RD OS, DFE, OCT.  There are no Patient Instructions on file for this visit.  Explained the diagnoses, plan, and follow up with the patient and they expressed understanding.  Patient expressed understanding of the importance of proper follow up care.   This document serves as a record of services personally performed by Jeanice Millard, MD, PhD. It was created on their behalf by Olene Berne, COT an ophthalmic technician. The creation of this record is the provider's dictation and/or activities during the visit.    Electronically signed by:  Olene Berne, COT  07/10/23 12:20 PM  This document serves as a record of services personally performed by Jeanice Millard, MD, PhD. It was created on their behalf by Morley Arabia. Bevin Bucks, OA an ophthalmic technician. The creation of this record is the provider's dictation and/or activities during the visit.    Electronically signed by: Morley Arabia. Bevin Bucks, OA 07/10/23 12:20 PM  Jeanice Millard, M.D., Ph.D. Diseases & Surgery of the Retina and Vitreous [facility] 07/10/2023  I have reviewed the above documentation for accuracy and completeness, and I agree with the above. Jeanice Millard, M.D., Ph.D. 07/10/23 12:24 PM   Abbreviations: M myopia (nearsighted); A astigmatism; H hyperopia (farsighted); P presbyopia; Mrx spectacle prescription;  CTL contact lenses; OD right eye; OS left eye; OU both eyes  XT exotropia; ET esotropia; PEK punctate epithelial keratitis; PEE punctate epithelial erosions; DES dry eye syndrome; MGD meibomian gland dysfunction; ATs artificial tears; PFAT's preservative free artificial tears; NSC nuclear sclerotic cataract; PSC posterior subcapsular cataract; ERM epi-retinal  membrane; PVD posterior vitreous detachment; RD retinal detachment; DM diabetes mellitus; DR diabetic retinopathy; NPDR non-proliferative diabetic retinopathy; PDR proliferative diabetic retinopathy; CSME clinically significant macular edema; DME diabetic macular edema; dbh dot blot hemorrhages; CWS cotton wool spot; POAG primary open angle glaucoma; C/D cup-to-disc ratio; HVF humphrey visual field; GVF goldmann visual field; OCT optical coherence tomography; IOP intraocular pressure; BRVO Branch retinal vein occlusion; CRVO central retinal vein occlusion; CRAO central retinal artery occlusion; BRAO branch retinal artery occlusion; RT retinal tear; SB scleral buckle; PPV pars plana vitrectomy; VH Vitreous hemorrhage; PRP panretinal laser photocoagulation; IVK intravitreal kenalog; VMT vitreomacular traction; MH Macular hole;  NVD neovascularization of the disc; NVE neovascularization elsewhere; AREDS age related eye disease study; ARMD age related macular degeneration; POAG primary open angle glaucoma; EBMD epithelial/anterior basement membrane dystrophy; ACIOL anterior chamber intraocular lens; IOL intraocular lens; PCIOL posterior chamber intraocular lens; Phaco/IOL phacoemulsification with intraocular lens placement; PRK photorefractive keratectomy; LASIK laser assisted in situ keratomileusis; HTN hypertension; DM diabetes mellitus; COPD chronic obstructive pulmonary disease

## 2023-07-10 ENCOUNTER — Ambulatory Visit (INDEPENDENT_AMBULATORY_CARE_PROVIDER_SITE_OTHER): Admitting: Ophthalmology

## 2023-07-10 ENCOUNTER — Encounter (INDEPENDENT_AMBULATORY_CARE_PROVIDER_SITE_OTHER): Payer: Self-pay | Admitting: Ophthalmology

## 2023-07-10 DIAGNOSIS — H4312 Vitreous hemorrhage, left eye: Secondary | ICD-10-CM

## 2023-07-10 DIAGNOSIS — H35411 Lattice degeneration of retina, right eye: Secondary | ICD-10-CM | POA: Diagnosis not present

## 2023-07-10 DIAGNOSIS — H3322 Serous retinal detachment, left eye: Secondary | ICD-10-CM | POA: Diagnosis not present

## 2023-07-10 DIAGNOSIS — H25813 Combined forms of age-related cataract, bilateral: Secondary | ICD-10-CM | POA: Diagnosis not present

## 2023-07-16 NOTE — H&P (Signed)
 Briana Lamb is an 63 y.o. female.    Chief Complaint: vitreous hemorrhage with retinal detachment, LEFT EYE  HPI: Pt with decreased vision OS secondary to a nonclearing vitreous hemorrhage that started on 04.18.25. At her follow up appointment on 05.13.25, a b-scan ultrasound showed a retinal detachment behind the vitreous hemorrhage. After a discussion of the risks benefits and alternatives to surgery, the patient has elected to proceed with surgery to clear the vitreous hemorrhage and repair the retinal detachment: scleral buckle procedure + 25g PPV w/ endolaser and gas vs silicon oil OS under general anesthesia.  Past Medical History:  Diagnosis Date   Anxiety    Arthritis    "knees" (01/08/2018)   Bee sting allergy    Seasonal allergies     Past Surgical History:  Procedure Laterality Date   ANKLE FRACTURE SURGERY Right 1995   ANKLE HARDWARE REMOVAL Right ~ 1996   CESAREAN SECTION  1999   FRACTURE SURGERY     JOINT REPLACEMENT     TOTAL KNEE ARTHROPLASTY Left 01/08/2018   TOTAL KNEE ARTHROPLASTY Left 01/08/2018   Procedure: TOTAL KNEE ARTHROPLASTY;  Surgeon: Dayne Even, MD;  Location: [facility] OR;  Service: Orthopedics;  Laterality: Left;   WRIST FRACTURE SURGERY Right 04/2006    Family History  Problem Relation Age of Onset   Stroke Maternal Grandfather    Social History:  reports that she quit smoking about 36 years ago. Her smoking use included cigarettes. She started smoking about 48 years ago. She has a 6 pack-year smoking history. She has never used smokeless tobacco. She reports that she does not currently use alcohol. She reports that she does not use drugs.  Allergies:  Allergies  Allergen Reactions   Penicillins Swelling and Other (See Comments)    SWELLING REACTION UNSPECIFIED  Has patient had a PCN reaction causing immediate rash, facial/tongue/throat swelling, SOB or lightheadedness with hypotension: unkn Has patient had a PCN reaction causing severe  rash involving mucus membranes or skin necrosis: unkn Has patient had a PCN reaction that required hospitalization: unkn Has patient had a PCN reaction occurring within the last 10 years: unkn If all of the above answers are "NO", then may proceed with Cephalosporin use.    Bee Venom Swelling    SWELLING LOCALIZED    No medications prior to admission.    Review of systems otherwise negative  There were no vitals taken for this visit.  Physical exam: Mental status: oriented x3. Eyes: See eye exam associated with this date of surgery Ears, Nose, Throat: within normal limits Neck: Within Normal limits General: within normal limits Chest: Within normal limits Breast: deferred Heart: Within normal limits Abdomen: Within normal limits GU: deferred Extremities: within normal limits Skin: within normal limits  Assessment/Plan 1. Vitreous hemorrhage, LEFT EYE 2. Retinal detachment, LEFT EYE  Plan: To [facility] for scleral buckle procedure + 25g PPV w/ endolaser and gas, LEFT EYE, under general anesthesia - case scheduled for Thursday, 05.22.25, [facility] OR 08  Jeanice Millard, M.D., Ph.D. Vitreoretinal Surgeon [facility]

## 2023-07-16 NOTE — Progress Notes (Deleted)
 [facility] - Clinic Note  07/20/2023   CHIEF COMPLAINT Patient presents for No chief complaint on file.  HISTORY OF PRESENT ILLNESS: Briana Lamb is a 63 y.o. female who presents to the clinic today for:   Patient feels like her vision is worse today, she states her vision looks like a kaleidescope, she saw a few fol over the weekend  Referring physician: [name], [name], OD [address] [place],  [place] [address]  HISTORICAL INFORMATION:  Selected notes from the MEDICAL RECORD NUMBER Referred by Dr. Micael Adas for concern of vitreous hemorrhage OS LEE:  Ocular Hx- PMH-   CURRENT MEDICATIONS: No current outpatient medications on file. (Ophthalmic Drugs)   No current facility-administered medications for this visit. (Ophthalmic Drugs)   Current Outpatient Medications (Other)  Medication Sig   Ascorbic Acid (VITAMIN C) 1000 MG tablet Take 1,000 mg by mouth daily.   b complex vitamins tablet Take 1 tablet by mouth daily.   L-Arginine 500 MG CAPS Take 500 mg by mouth daily.   L-CITRULLINE PO Take 1 capsule by mouth daily.   Multiple Vitamins-Minerals (MULTIVITAMIN WITH MINERALS) tablet Take 1 tablet by mouth daily.   TURMERIC CURCUMIN PO Take 1 capsule by mouth daily.   No current facility-administered medications for this visit. (Other)   REVIEW OF SYSTEMS:   ALLERGIES Allergies  Allergen Reactions   Penicillins Swelling and Other (See Comments)    SWELLING REACTION UNSPECIFIED  Has patient had a PCN reaction causing immediate rash, facial/tongue/throat swelling, SOB or lightheadedness with hypotension: unkn Has patient had a PCN reaction causing severe rash involving mucus membranes or skin necrosis: unkn Has patient had a PCN reaction that required hospitalization: unkn Has patient had a PCN reaction occurring within the last 10 years: unkn If all of the above answers are "NO", then may proceed with Cephalosporin use.    Bee Venom  Swelling    SWELLING LOCALIZED   PAST MEDICAL HISTORY Past Medical History:  Diagnosis Date   Anxiety    Arthritis    "knees" (01/08/2018)   Bee sting allergy    Seasonal allergies    Past Surgical History:  Procedure Laterality Date   ANKLE FRACTURE SURGERY Right 1995   ANKLE HARDWARE REMOVAL Right ~ 1996   CESAREAN SECTION  1999   FRACTURE SURGERY     JOINT REPLACEMENT     TOTAL KNEE ARTHROPLASTY Left 01/08/2018   TOTAL KNEE ARTHROPLASTY Left 01/08/2018   Procedure: TOTAL KNEE ARTHROPLASTY;  Surgeon: Dayne Even, MD;  Location: [facility] OR;  Service: Orthopedics;  Laterality: Left;   WRIST FRACTURE SURGERY Right 04/2006   FAMILY HISTORY Family History  Problem Relation Age of Onset   Stroke Maternal Grandfather    SOCIAL HISTORY Social History   Tobacco Use   Smoking status: Former    Current packs/day: 0.00    Average packs/day: 0.5 packs/day for 12.0 years (6.0 ttl pk-yrs)    Types: Cigarettes    Start date: 39    Quit date: 1989    Years since quitting: 36.4   Smokeless tobacco: Never  Vaping Use   Vaping status: Never Used  Substance Use Topics   Alcohol use: Not Currently    Comment: 01/08/2018 "a couple drinks twice/month"   Drug use: Never       OPHTHALMIC EXAM:  Not recorded    IMAGING AND PROCEDURES  Imaging and Procedures for 07/20/2023          ASSESSMENT/PLAN:  ICD-10-CM   1. Left retinal detachment  H33.22     2. 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 Vitreous hemorrhage and retinal detachment OS  - onset Friday, 04.18.25 while vacationing in [place]  - s/p IVA OS #1  (04.22.25)  - BCVA OS CF @ face - stable, no improvement  - exam shows diffuse VH -- persistent - B-scan ultrasound today (05.13.25) shows total RD behind diffuse VH - s/p POD1 s/p PPV/PFO/EL/FAX/14% C3F8 OS, 05.22.2025             - doing well this morning             - retina attached and in good position -- good buckle height and laser around breaks             - IOP mildly elevated              - start   PF 4x/day OS                         zymaxid QID OS                          Atropine BID OS                          Brimonidine BID OS                          Cosopt BID OS                         PSO ung QID OS              - cont face down positioning x3 days; avoid laying flat on back              - eye shield when sleeping              - post op drop and positioning instructions reviewed              - tylenol /ibuprofen for pain              - Rx given for breakthrough pain  - f/u    3. Lattice degeneration right eye - small patch of pigmented lattice at 0630 - asymptomatic - discussed findings, prognosis, and treatment options including observation - recommend laser retinopexy OD -- but will have to schedule procedure once laser comes in  4. Mixed Cataract OU - The symptoms of cataract, surgical options, and treatments and risks were discussed with patient. - discussed diagnosis and progression - monitor  Ophthalmic Meds Ordered this visit:  No orders of the defined types were placed in this encounter.    No follow-ups on file.  There are no Patient Instructions on file for this visit.  Explained the diagnoses, plan, and follow up with the patient and they expressed understanding.  Patient expressed understanding of  the importance of proper follow up care.   This document serves as a record of services personally performed by Jeanice Millard, MD, PhD. It was created on their behalf by Morley Arabia. Bevin Bucks, OA an ophthalmic technician. The creation of this record is  the provider's dictation and/or activities during the visit.    Electronically signed by: Morley Arabia. Bevin Bucks, OA 07/16/23 12:38 PM   Jeanice Millard, M.D., Ph.D. Diseases & Surgery of the Retina and Vitreous [facility] 07/20/2023    Abbreviations: M myopia (nearsighted); A astigmatism; H hyperopia (farsighted); P presbyopia; Mrx spectacle prescription;  CTL contact lenses; OD right eye; OS left eye; OU both eyes  XT exotropia; ET esotropia; PEK punctate epithelial keratitis; PEE punctate epithelial erosions; DES dry eye syndrome; MGD meibomian gland dysfunction; ATs artificial tears; PFAT's preservative free artificial tears; NSC nuclear sclerotic cataract; PSC posterior subcapsular cataract; ERM epi-retinal membrane; PVD posterior vitreous detachment; RD retinal detachment; DM diabetes mellitus; DR diabetic retinopathy; NPDR non-proliferative diabetic retinopathy; PDR proliferative diabetic retinopathy; CSME clinically significant macular edema; DME diabetic macular edema; dbh dot blot hemorrhages; CWS cotton wool spot; POAG primary open angle glaucoma; C/D cup-to-disc ratio; HVF humphrey visual field; GVF goldmann visual field; OCT optical coherence tomography; IOP intraocular pressure; BRVO Branch retinal vein occlusion; CRVO central retinal vein occlusion; CRAO central retinal artery occlusion; BRAO branch retinal artery occlusion; RT retinal tear; SB scleral buckle; PPV pars plana vitrectomy; VH Vitreous hemorrhage; PRP panretinal laser photocoagulation; IVK intravitreal kenalog; VMT vitreomacular traction; MH Macular hole;  NVD neovascularization of the disc; NVE neovascularization elsewhere; AREDS age related eye disease study; ARMD age related macular degeneration; POAG primary open angle glaucoma; EBMD epithelial/anterior basement membrane dystrophy; ACIOL anterior chamber intraocular lens; IOL intraocular lens; PCIOL posterior chamber intraocular lens; Phaco/IOL  phacoemulsification with intraocular lens placement; PRK photorefractive keratectomy; LASIK laser assisted in situ keratomileusis; HTN hypertension; DM diabetes mellitus; COPD chronic obstructive pulmonary disease

## 2023-07-17 ENCOUNTER — Encounter (HOSPITAL_COMMUNITY): Payer: Self-pay | Admitting: Ophthalmology

## 2023-07-17 ENCOUNTER — Other Ambulatory Visit: Payer: Self-pay

## 2023-07-17 NOTE — Progress Notes (Signed)
 PCP - Dr. Elyce Hams (PCP has moved, pt has not found new PCP yet) Cardiologist - denies  PPM/ICD - denies   Chest x-ray - 12/28/17 EKG - 12/21/17 (n/a) Stress Test - denies ECHO - denies Cardiac Cath - denies  CPAP - denies  DM- denies  ASA/Blood Thinner Instructions: n/a   ERAS Protcol - clears until 0930  COVID TEST- n/a  Anesthesia review: no  Patient verbally denies any shortness of breath, fever, cough and chest pain during phone call     Questions were answered. Patient verbalized understanding of instructions.

## 2023-07-17 NOTE — Pre-Procedure Instructions (Signed)
-------------    SDW INSTRUCTIONS given:  Your procedure is scheduled on 5/22.  Report to [facility] Main Entrance "A" at 10:00 A.M., and check in at the Admitting office.  Any questions or running late day of surgery: call [phone]    Remember:  Do not eat after midnight the night before your surgery  You may drink clear liquids until 09:30 AM the morning of your surgery.   Clear liquids allowed are: Water, Non-Citrus Juices (without pulp), Carbonated Beverages, Clear Tea, Black Coffee Only, and Gatorade    Take these medicines the morning of surgery with A SIP OF WATER: NONE   As of today, STOP taking any Aspirin  (unless otherwise instructed by your surgeon) Aleve, Naproxen, Ibuprofen, Motrin, Advil, Goody's, BC's, all herbal medications, fish oil, and all vitamins.   Do NOT Smoke (Tobacco/Vaping) 24 hours prior to your procedure  If you use a CPAP at night, you may bring all equipment for your overnight stay.     You will be asked to remove any contacts, glasses, piercing's, hearing aid's, dentures/partials prior to surgery. Please bring cases for these items if needed.     Patients discharged the day of surgery will not be allowed to drive home, and someone needs to stay with them for 24 hours.  SURGICAL WAITING ROOM VISITATION Patients may have no more than 2 support people in the waiting area - these visitors may rotate.   Pre-op nurse will coordinate an appropriate time for 1 ADULT support person, who may not rotate, to accompany patient in pre-op.  Children under the age of 49 must have an adult with them who is not the patient and must remain in the main waiting area with an adult.  If the patient needs to stay at the hospital during part of their recovery, the visitor guidelines for inpatient rooms apply.  Please refer to the [facility] website for the visitor guidelines for any additional information.   Special instructions:   [place]- Preparing For  Surgery   Please follow these instructions carefully.   Shower the NIGHT BEFORE SURGERY and the MORNING OF SURGERY with DIAL Soap.   Pat yourself dry with a CLEAN TOWEL.  Wear CLEAN PAJAMAS to bed the night before surgery  Place CLEAN SHEETS on your bed the night of your first shower and DO NOT SLEEP WITH PETS.   Additional instructions for the day of surgery: DO NOT APPLY any lotions, deodorants, cologne, or perfumes.   Do not wear jewelry or makeup Do not wear nail polish, gel polish, artificial nails, or any other type of covering on natural nails (fingers and toes) Do not bring valuables to the hospital. [facility] is not responsible for valuables/personal belongings. Put on clean/comfortable clothes.  Please brush your teeth.  Ask your nurse before applying any prescription medications to the skin.

## 2023-07-18 NOTE — Progress Notes (Deleted)
 Briana Lamb is an 63 y.o. female.    Chief Complaint: vitreous hemorrhage with retinal detachment, LEFT EYE  HPI: Pt with decreased vision OS secondary to a nonclearing vitreous hemorrhage that started on 04.18.25. At her follow up appointment on 05.13.25, a b-scan ultrasound showed a retinal detachment behind the vitreous hemorrhage. After a discussion of the risks benefits and alternatives to surgery, the patient has elected to proceed with surgery to clear the vitreous hemorrhage and repair the retinal detachment: scleral buckle procedure + 25g PPV w/ endolaser and gas vs silicon oil OS under general anesthesia.  Past Medical History:  Diagnosis Date   Anxiety    situational per pt (about surgery)   Arthritis    "knees" (01/08/2018)   Bee sting allergy    Seasonal allergies     Past Surgical History:  Procedure Laterality Date   ANKLE FRACTURE SURGERY Right 1995   ANKLE HARDWARE REMOVAL Right ~ 1996   CESAREAN SECTION  1999   FRACTURE SURGERY     JOINT REPLACEMENT     TOTAL KNEE ARTHROPLASTY Left 01/08/2018   TOTAL KNEE ARTHROPLASTY Left 01/08/2018   Procedure: TOTAL KNEE ARTHROPLASTY;  Surgeon: Dayne Even, MD;  Location: [facility] OR;  Service: Orthopedics;  Laterality: Left;   WRIST FRACTURE SURGERY Right 04/2006    Family History  Problem Relation Age of Onset   Stroke Maternal Grandfather    Social History:  reports that she quit smoking about 36 years ago. Her smoking use included cigarettes. She started smoking about 48 years ago. She has a 6 pack-year smoking history. She has never used smokeless tobacco. She reports current alcohol use. She reports that she does not use drugs.  Allergies:  Allergies  Allergen Reactions   Penicillins Swelling and Other (See Comments)    SWELLING REACTION UNSPECIFIED  Has patient had a PCN reaction causing immediate rash, facial/tongue/throat swelling, SOB or lightheadedness with hypotension: unkn Has patient had a PCN  reaction causing severe rash involving mucus membranes or skin necrosis: unkn Has patient had a PCN reaction that required hospitalization: unkn Has patient had a PCN reaction occurring within the last 10 years: unkn If all of the above answers are "NO", then may proceed with Cephalosporin use.    Bee Venom Swelling    SWELLING LOCALIZED    (Not in a hospital admission)   Review of systems otherwise negative  There were no vitals taken for this visit.  Physical exam: Mental status: oriented x3. Eyes: See eye exam associated with this date of surgery Ears, Nose, Throat: within normal limits Neck: Within Normal limits General: within normal limits Chest: Within normal limits Breast: deferred Heart: Within normal limits Abdomen: Within normal limits GU: deferred Extremities: within normal limits Skin: within normal limits  Assessment/Plan 1. Vitreous hemorrhage, LEFT EYE 2. Retinal detachment, LEFT EYE  Plan: To [facility] for scleral buckle procedure + 25g PPV w/ endolaser and gas, LEFT EYE, under general anesthesia - case scheduled for Thursday, 05.22.25, [facility] OR 08  Jeanice Millard, M.D., Ph.D. Vitreoretinal Surgeon [facility]

## 2023-07-19 ENCOUNTER — Encounter (HOSPITAL_COMMUNITY): Payer: Self-pay | Admitting: Ophthalmology

## 2023-07-19 ENCOUNTER — Ambulatory Visit (HOSPITAL_COMMUNITY)

## 2023-07-19 ENCOUNTER — Ambulatory Visit (HOSPITAL_COMMUNITY)
Admission: RE | Admit: 2023-07-19 | Discharge: 2023-07-19 | Disposition: A | Discharge status: Home / Self Care | Attending: Ophthalmology | Admitting: Ophthalmology

## 2023-07-19 ENCOUNTER — Other Ambulatory Visit: Payer: Self-pay

## 2023-07-19 ENCOUNTER — Ambulatory Visit (HOSPITAL_BASED_OUTPATIENT_CLINIC_OR_DEPARTMENT_OTHER)

## 2023-07-19 ENCOUNTER — Encounter (HOSPITAL_COMMUNITY)
Admission: RE | Disposition: A | Discharge status: Home / Self Care | Payer: Self-pay | Source: Home / Self Care | Attending: Ophthalmology

## 2023-07-19 DIAGNOSIS — H3322 Serous retinal detachment, left eye: Secondary | ICD-10-CM | POA: Diagnosis present

## 2023-07-19 DIAGNOSIS — Z87891 Personal history of nicotine dependence: Secondary | ICD-10-CM | POA: Insufficient documentation

## 2023-07-19 DIAGNOSIS — H4312 Vitreous hemorrhage, left eye: Secondary | ICD-10-CM | POA: Insufficient documentation

## 2023-07-19 HISTORY — PX: VITRECTOMY 25 GAUGE WITH SCLERAL BUCKLE: SHX6183

## 2023-07-19 LAB — CBC
HCT: 32 % — ABNORMAL LOW (ref 36.0–46.0)
Hemoglobin: 11.1 g/dL — ABNORMAL LOW (ref 12.0–15.0)
MCH: 36.3 pg — ABNORMAL HIGH (ref 26.0–34.0)
MCHC: 34.7 g/dL (ref 30.0–36.0)
MCV: 104.6 fL — ABNORMAL HIGH (ref 80.0–100.0)
Platelets: 475 10*3/uL — ABNORMAL HIGH (ref 150–400)
RBC: 3.06 MIL/uL — ABNORMAL LOW (ref 3.87–5.11)
RDW: 13.8 % (ref 11.5–15.5)
WBC: 5.6 10*3/uL (ref 4.0–10.5)
nRBC: 0.4 % — ABNORMAL HIGH (ref 0.0–0.2)

## 2023-07-19 SURGERY — VITRECTOMY, USING 25-GAUGE INSTRUMENTS, WITH SCLERAL BUCKLING
Anesthesia: General | Laterality: Left

## 2023-07-19 MED ORDER — HYDROCODONE-ACETAMINOPHEN 5-325 MG PO TABS: 1.0000 | ORAL_TABLET | ORAL | 0 refills | Status: AC | PRN

## 2023-07-19 MED ORDER — DEXAMETHASONE SODIUM PHOSPHATE 10 MG/ML IJ SOLN
INTRAMUSCULAR | Status: DC | PRN
  Administered 2023-07-19: 10 mg via INTRAVENOUS

## 2023-07-19 MED ORDER — ONDANSETRON HCL 4 MG/2ML IJ SOLN
INTRAMUSCULAR | Status: DC | PRN
  Administered 2023-07-19: 4 mg via INTRAVENOUS

## 2023-07-19 MED ORDER — TROPICAMIDE 1 % OP SOLN
1.0000 [drp] | OPHTHALMIC | Status: AC | PRN
  Administered 2023-07-19 (×3): 1 [drp] via OPHTHALMIC
  Filled 2023-07-19: qty 15

## 2023-07-19 MED ORDER — BSS PLUS IO SOLN
INTRAOCULAR | Status: DC | PRN
  Administered 2023-07-19: 1 via INTRAOCULAR

## 2023-07-19 MED ORDER — BSS IO SOLN
INTRAOCULAR | Status: DC | PRN
  Administered 2023-07-19: 15 mL via INTRAOCULAR

## 2023-07-19 MED ORDER — PROPOFOL 10 MG/ML IV BOLUS
INTRAVENOUS | Status: DC | PRN
  Administered 2023-07-19: 100 mg via INTRAVENOUS

## 2023-07-19 MED ORDER — SUGAMMADEX SODIUM 200 MG/2ML IV SOLN
INTRAVENOUS | Status: DC | PRN
Start: 2023-07-19 — End: 2023-07-19
  Administered 2023-07-19: 211.4 mg via INTRAVENOUS

## 2023-07-19 MED ORDER — DEXAMETHASONE SODIUM PHOSPHATE 10 MG/ML IJ SOLN
INTRAMUSCULAR | Status: AC
  Filled 2023-07-19: qty 1

## 2023-07-19 MED ORDER — CYCLOPENTOLATE HCL 1 % OP SOLN
1.0000 [drp] | OPHTHALMIC | Status: AC | PRN
  Administered 2023-07-19 (×3): 1 [drp] via OPHTHALMIC
  Filled 2023-07-19: qty 2

## 2023-07-19 MED ORDER — PHENYLEPHRINE 80 MCG/ML (10ML) SYRINGE FOR IV PUSH (FOR BLOOD PRESSURE SUPPORT)
PREFILLED_SYRINGE | INTRAVENOUS | Status: AC
  Filled 2023-07-19: qty 10

## 2023-07-19 MED ORDER — CEFTAZIDIME 1 G IJ SOLR
INTRAMUSCULAR | Status: AC
  Filled 2023-07-19: qty 1

## 2023-07-19 MED ORDER — PHENYLEPHRINE HCL 10 % OP SOLN
1.0000 [drp] | OPHTHALMIC | Status: AC | PRN
  Administered 2023-07-19 (×3): 1 [drp] via OPHTHALMIC
  Filled 2023-07-19: qty 5

## 2023-07-19 MED ORDER — PREDNISOLONE ACETATE 1 % OP SUSP
OPHTHALMIC | Status: AC
  Filled 2023-07-19: qty 5

## 2023-07-19 MED ORDER — ACETAMINOPHEN 500 MG PO TABS
1000.0000 mg | ORAL_TABLET | Freq: Once | ORAL | Status: AC
  Administered 2023-07-19: 1000 mg via ORAL
  Filled 2023-07-19: qty 2

## 2023-07-19 MED ORDER — LIDOCAINE 2% (20 MG/ML) 5 ML SYRINGE
INTRAMUSCULAR | Status: AC
  Filled 2023-07-19: qty 5

## 2023-07-19 MED ORDER — BACITRACIN-POLYMYXIN B 500-10000 UNIT/GM OP OINT
TOPICAL_OINTMENT | OPHTHALMIC | Status: AC
Start: 2023-07-19 — End: ?
  Filled 2023-07-19: qty 3.5

## 2023-07-19 MED ORDER — TRIAMCINOLONE ACETONIDE 40 MG/ML IJ SUSP
INTRAMUSCULAR | Status: AC
  Filled 2023-07-19: qty 5

## 2023-07-19 MED ORDER — POLYMYXIN B SULFATE 500000 UNITS IJ SOLR
INTRAMUSCULAR | Status: AC
  Filled 2023-07-19: qty 10

## 2023-07-19 MED ORDER — STERILE WATER FOR INJECTION IJ SOLN
INTRAMUSCULAR | Status: DC | PRN
  Administered 2023-07-19: 20 mL

## 2023-07-19 MED ORDER — LIDOCAINE 2% (20 MG/ML) 5 ML SYRINGE
INTRAMUSCULAR | Status: DC | PRN
  Administered 2023-07-19: 60 mg via INTRAVENOUS

## 2023-07-19 MED ORDER — LIDOCAINE HCL (PF) 1 % IJ SOLN
INTRAMUSCULAR | Status: DC | PRN
  Administered 2023-07-19: 5 mL

## 2023-07-19 MED ORDER — FENTANYL CITRATE (PF) 250 MCG/5ML IJ SOLN
INTRAMUSCULAR | Status: DC | PRN
  Administered 2023-07-19: 100 ug via INTRAVENOUS
  Administered 2023-07-19 (×3): 50 ug via INTRAVENOUS

## 2023-07-19 MED ORDER — NA CHONDROIT SULF-NA HYALURON 40-30 MG/ML IO SOSY
INTRAOCULAR | Status: AC
  Filled 2023-07-19: qty 0.5

## 2023-07-19 MED ORDER — SODIUM CHLORIDE (PF) 0.9 % IJ SOLN
INTRAMUSCULAR | Status: DC | PRN
  Administered 2023-07-19: 10 mL via INTRAVENOUS

## 2023-07-19 MED ORDER — ROCURONIUM BROMIDE 10 MG/ML (PF) SYRINGE
PREFILLED_SYRINGE | INTRAVENOUS | Status: DC | PRN
  Administered 2023-07-19: 30 mg via INTRAVENOUS
  Administered 2023-07-19: 20 mg via INTRAVENOUS
  Administered 2023-07-19: 50 mg via INTRAVENOUS

## 2023-07-19 MED ORDER — BUPIVACAINE HCL (PF) 0.75 % IJ SOLN
INTRAMUSCULAR | Status: DC | PRN
  Administered 2023-07-19: 5 mL

## 2023-07-19 MED ORDER — PHENYLEPHRINE 80 MCG/ML (10ML) SYRINGE FOR IV PUSH (FOR BLOOD PRESSURE SUPPORT)
PREFILLED_SYRINGE | INTRAVENOUS | Status: DC | PRN
  Administered 2023-07-19: 80 ug via INTRAVENOUS

## 2023-07-19 MED ORDER — PHENYLEPHRINE HCL-NACL 20-0.9 MG/250ML-% IV SOLN
INTRAVENOUS | Status: AC
  Filled 2023-07-19: qty 250

## 2023-07-19 MED ORDER — ONDANSETRON HCL 4 MG/2ML IJ SOLN
INTRAMUSCULAR | Status: AC
  Filled 2023-07-19: qty 2

## 2023-07-19 MED ORDER — BSS PLUS IO SOLN
INTRAOCULAR | Status: AC
  Filled 2023-07-19: qty 500

## 2023-07-19 MED ORDER — FENTANYL CITRATE (PF) 250 MCG/5ML IJ SOLN
INTRAMUSCULAR | Status: AC
  Filled 2023-07-19: qty 5

## 2023-07-19 MED ORDER — SODIUM CHLORIDE 0.9 % IV SOLN: INTRAVENOUS | Status: DC

## 2023-07-19 MED ORDER — STERILE WATER FOR INJECTION IJ SOLN
INTRAMUSCULAR | Status: DC | PRN
  Administered 2023-07-19: 10 mL

## 2023-07-19 MED ORDER — BRIMONIDINE TARTRATE 0.2 % OP SOLN
OPHTHALMIC | Status: AC
  Filled 2023-07-19: qty 5

## 2023-07-19 MED ORDER — ATROPINE SULFATE 1 % OP SOLN
1.0000 [drp] | OPHTHALMIC | Status: AC | PRN
  Administered 2023-07-19 (×3): 1 [drp] via OPHTHALMIC
  Filled 2023-07-19: qty 2

## 2023-07-19 MED ORDER — EPINEPHRINE PF 1 MG/ML IJ SOLN
INTRAMUSCULAR | Status: AC
  Filled 2023-07-19: qty 1

## 2023-07-19 MED ORDER — CHLORHEXIDINE GLUCONATE 0.12 % MT SOLN
15.0000 mL | Freq: Once | OROMUCOSAL | Status: AC
  Administered 2023-07-19: 15 mL via OROMUCOSAL
  Filled 2023-07-19: qty 15

## 2023-07-19 MED ORDER — ROCURONIUM BROMIDE 10 MG/ML (PF) SYRINGE
PREFILLED_SYRINGE | INTRAVENOUS | Status: AC
  Filled 2023-07-19: qty 10

## 2023-07-19 MED ORDER — ORAL CARE MOUTH RINSE: 15.0000 mL | Freq: Once | OROMUCOSAL | Status: AC

## 2023-07-19 MED ORDER — PROPOFOL 10 MG/ML IV BOLUS
INTRAVENOUS | Status: AC
  Filled 2023-07-19: qty 20

## 2023-07-19 MED ORDER — PROPARACAINE HCL 0.5 % OP SOLN
1.0000 [drp] | OPHTHALMIC | Status: AC | PRN
  Administered 2023-07-19 (×3): 1 [drp] via OPHTHALMIC
  Filled 2023-07-19: qty 15

## 2023-07-19 SURGICAL SUPPLY — 51 items
APPLICATOR COTTON TIP 6 STRL (MISCELLANEOUS) ×4 IMPLANT
APPLICATOR COTTON TIP 6IN STRL (MISCELLANEOUS) ×4 IMPLANT
BAND SCLERAL BUCKLING TYPE 41 (Ophthalmic Related) IMPLANT
BAND WRIST GAS GREEN (MISCELLANEOUS) IMPLANT
BETADINE 5% OPHTHALMIC (OPHTHALMIC) ×2 IMPLANT
BNDG EYE OVAL 2 1/8 X 2 5/8 (GAUZE/BANDAGES/DRESSINGS) IMPLANT
CABLE BIPOLOR RESECTION CORD (MISCELLANEOUS) IMPLANT
CANNULA DUALBORE 25G (CANNULA) ×1 IMPLANT
CANNULA FLEX TIP 25G (CANNULA) ×1 IMPLANT
CLSR STERI-STRIP ANTIMIC 1/2X4 (GAUZE/BANDAGES/DRESSINGS) ×1 IMPLANT
COVER SURGICAL LIGHT HANDLE (MISCELLANEOUS) ×1 IMPLANT
DRAPE INCISE 51X51 W/FILM STRL (DRAPES) ×1 IMPLANT
DRAPE MICROSCOPE LEICA 46X105 (MISCELLANEOUS) ×1 IMPLANT
DRAPE OPHTHALMIC 77X100 STRL (CUSTOM PROCEDURE TRAY) ×1 IMPLANT
FILTER STRAW FLUID ASPIR (MISCELLANEOUS) IMPLANT
FORCEPS GRIESHABER ILM 25G A (INSTRUMENTS) IMPLANT
GAS AUTO FILL CONSTELLATION (OPHTHALMIC) IMPLANT
GLOVE BIO SURGEON STRL SZ7.5 (GLOVE) ×2 IMPLANT
GLOVE BIOGEL M 7.0 STRL (GLOVE) ×1 IMPLANT
GOWN STRL REUS W/ TWL LRG LVL3 (GOWN DISPOSABLE) ×2 IMPLANT
GOWN STRL REUS W/ TWL XL LVL3 (GOWN DISPOSABLE) ×1 IMPLANT
KIT BASIN OR (CUSTOM PROCEDURE TRAY) ×1 IMPLANT
KIT PERFLUORON PROCEDURE 5ML (MISCELLANEOUS) IMPLANT
LENS VITRECTOMY FLAT OCLR DISP (MISCELLANEOUS) IMPLANT
NDL 18GX1X1/2 (RX/OR ONLY) (NEEDLE) ×2 IMPLANT
NDL 25GX 5/8IN NON SAFETY (NEEDLE) ×1 IMPLANT
NDL FILTER BLUNT 18X1 1/2 (NEEDLE) ×1 IMPLANT
NDL HYPO 30X.5 LL (NEEDLE) IMPLANT
NEEDLE 18GX1X1/2 (RX/OR ONLY) (NEEDLE) ×2 IMPLANT
NEEDLE 25GX 5/8IN NON SAFETY (NEEDLE) ×1 IMPLANT
NEEDLE FILTER BLUNT 18X1 1/2 (NEEDLE) ×1 IMPLANT
NEEDLE HYPO 30X.5 LL (NEEDLE) ×1 IMPLANT
NS IRRIG 1000ML POUR BTL (IV SOLUTION) ×1 IMPLANT
PACK CATARACT/VITRECTOMY 25GA (OPHTHALMIC) IMPLANT
PACK VITRECTOMY CUSTOM (CUSTOM PROCEDURE TRAY) ×1 IMPLANT
PAD ARMBOARD POSITIONER FOAM (MISCELLANEOUS) ×2 IMPLANT
PAK PIK VITRECTOMY CVS 25GA (OPHTHALMIC) ×1 IMPLANT
PIK ILLUMINATED 25G (OPHTHALMIC) IMPLANT
PROBE ENDO DIATHERMY 25G (MISCELLANEOUS) IMPLANT
PROBE LASER ILLUM FLEX CVD 25G (OPHTHALMIC) IMPLANT
SLEEVE SCLERAL BUCK TYPE 70 (Ophthalmic Related) ×1 IMPLANT
SUT ETHILON 5.0 S-24 (SUTURE) ×1 IMPLANT
SUT ETHILON 9 0 TG140 8 (SUTURE) IMPLANT
SUT SILK 2-0 18XBRD TIE 12 (SUTURE) ×1 IMPLANT
SUT SILK 2-0 18XBRD TIE BLK (SUTURE) ×1 IMPLANT
SUT VICRYL 7 0 TG140 8 (SUTURE) ×1 IMPLANT
SYR 10ML LL (SYRINGE) ×2 IMPLANT
SYR TB 1ML LUER SLIP (SYRINGE) ×1 IMPLANT
TOWEL GREEN STERILE FF (TOWEL DISPOSABLE) ×1 IMPLANT
TRAY FOLEY W/BAG SLVR 14FR (SET/KITS/TRAYS/PACK) IMPLANT
WATER STERILE IRR 1000ML POUR (IV SOLUTION) ×1 IMPLANT

## 2023-07-19 NOTE — Op Note (Signed)
 Date of procedure: 05.22.2025   Surgeon: Ronelle Coffee, MD, PhD   Assistant: Argyle Began, Ophthalmic Assistant    Pre-operative Diagnosis:  Vitreous hemorrhage, Left Eye Retinal Detachment, Left Eye   Post-operative diagnosis:  Vitreous hemorrhage, Left Eye Retinal Detachment, Left Eye   Anesthesia: GETA   Procedures: 1)     Scleral Buckle, Left Eye 2)     25 gauge pars plana vitrectomy, Left Eye CPT [phone] 3)     Perfluorocarbon injection 4)     Fluid-air exchange, Left Eye 5)     Endolaser, Left Eye 6)     Injection of 14% C3F8 gas   Complications: none Estimated blood loss: minimal Specimens: none   Brief history:   The patient has a history of decreased vision in the affected left eye from a diffuse, nonclearing vitreous hemorrhage. At a follow-up examination for the vitreous hemorrhage, a b-scan ultrasound revealed a macula-involving total retinal detachment. The risks, benefits, and alternatives were explained to the patient, including pain, bleeding, infection, loss of vision, double vision, droopy eyelids, and need for more surgeries.  Informed consent was obtained from the patient and placed in the chart.   Procedure:             The patient was brought to the preoperative holding area where the correct eye was confirmed and marked.  The patient was then brought to the operating room where general endotracheal anesthesia was induced. A secondary time-out was performed to identify the correct patient, eyes, procedures, and any allergies. The left eye was prepped and draped in the usual sterile ophthalmic fashion followed by placement of a lid speculum.             A 360 conjunctival peritomy was created using Westcott scissors and 0.12 forceps. Each of the four quadrants between the rectus muscles was dissected using Stevens scissors to detach Tenon's attachments from the globe. Each of the four rectus muscles was isolated on a muscle hook and slung using 2-0 Silk suture in  the usual standard fashion. Each of the four quadrants between the rectus muscles was inspected and there were noted to be no areas of scleral thinning. A #41 silicone band was then brought onto the field and was threaded under each rectus muscle. The band was then loosely secured using a #70 Watzke sleeve in the inferotemporal quadrant. The band was then sutured to the sclera in each quadrant using 5-0 nylon sutures passed partial thickness through the sclera in a horizontal mattress fashion. The scleral buckle was then tightened to the appropriate height with two locking needle drivers. Attention was then turned to the vitrectomy portion of the procedure.              A 25 gauge trocar was placed in the inferotemporal quadrant in a beveled fashion. A 4 mm infusion cannula was placed through this trocar, and the infusion cannula was confirmed in the vitreous cavity with no incarceration of retina or choroid prior to turning it on. Two additional 25 gauge trocars were placed in the superonasal and superotemporal quadrants (2 and 10 oclock, respectively) in a similar beveled fashion. At this time, a standard three-port pars plana vitrectomy was performed using the light pipe, the cutter, and the BIOM viewing system. Of note, visualization of the posterior pole was compromised by old, white blood-stained vitreous condensations A thorough anterior, core and peripheral vitreous dissection was performed to clear the diffuse vitreous hemorrhage. A posterior vitreous detachment was confirmed over  the optic nerve. There was a total retinal detachment involving the macula and fovea. There were two retinal tears in the 1030 meridian within the detached retina. Of note, the detached retina had significant corrugations, but was not particularly stiff or rigid.             Traction was removed from all retinal breaks. The breaks were trimmed using the cutter to smooth the edges and marked with diathermy. Perfluoron was  injected to push the subretinal fluid anterior to the scleral buckle. Under perfluoron, endolaser was applied over the posterior edge of the scleral buckle and just posterior to the buckle. Then, a complete fluid-air exchange was performed with a soft tip extrusion cannula over the breaks, then posteriorly to remove the perfluoron. After completion of these maneuvers, the retina was flat over the macula and over the scleral buckle. Under air, endolaser was applied to all the breaks and over and anterior to the scleral buckle to the ora.             At this time, the buckle height was confirmed and the buckle was finalized by trimming the band ends. The superotemporal trocar was removed and sutured with 7-0 vicryl in an interrupted fashion.  A complete air to 14% C3F8 gas exchange was performed through the infusion cannula and vented through the superonasal trocar using the extrusion cannula. The superonasal trocar and infusion cannula and associated trocar were then removed and sutured with 7-0 vicryl in an interrupted fashion. Kefzol  + polymixin irrigation was then used over the buckle. A subtenon's block containing 0.75% marcaine  and 2% lidocaine was administered.              The conjunctiva was closed with 7-0 vicryl sutures. The eye's intraocular pressure was confirmed to be at a physiologic level by digital palpation. Subconjunctival injections of Antibiotic and kenalog were administered. The lid speculum and drapes were removed. Drops of an antibiotic, antihypertensives, and steroid were given. Copious antibiotic ointment was instilled into the eye. The eye was patched and shielded. The patient tolerated the procedure well without any intraoperative or immediate postoperative complications. The patient was taken to the recovery room in good condition. The patient was instructed to maintain a strict face-down position and will be seen by Dr. Karyl Paget tomorrow morning in clinic.

## 2023-07-19 NOTE — Transfer of Care (Signed)
 Immediate Anesthesia Transfer of Care Note  Patient: Briana Lamb  Procedure(s) Performed: VITRECTOMY, USING 25-GAUGE INSTRUMENTS, WITH SCLERAL BUCKLING (Left)  Patient Location: PACU  Anesthesia Type:General  Level of Consciousness: awake, alert , and oriented  Airway & Oxygen Therapy: Patient Spontanous Breathing  Post-op Assessment: Report given to RN and Post -op Vital signs reviewed and stable  Post vital signs: Reviewed and stable  Last Vitals:  Vitals Value Taken Time  BP 153/51 07/19/23 1703  Temp 36.9 C 07/19/23 1703  Pulse 84 07/19/23 1713  Resp 14 07/19/23 1713  SpO2 92 % 07/19/23 1713  Vitals shown include unfiled device data.  Last Pain:  Vitals:   07/19/23 1703  TempSrc:   PainSc: Asleep         Complications: No notable events documented.

## 2023-07-19 NOTE — Anesthesia Preprocedure Evaluation (Addendum)
 Anesthesia Evaluation  Patient identified by MRN, date of birth, ID band Patient awake    Reviewed: Allergy & Precautions, NPO status , Patient's Chart, lab work & pertinent test results  Airway Mallampati: II  TM Distance: >3 FB Neck ROM: Full    Dental no notable dental hx. (+) Dental Advisory Given, Teeth Intact   Pulmonary former smoker   Pulmonary exam normal breath sounds clear to auscultation       Cardiovascular negative cardio ROS  Rhythm:Regular Rate:Normal     Neuro/Psych   Anxiety     negative neurological ROS     GI/Hepatic negative GI ROS, Neg liver ROS,,,  Endo/Other  negative endocrine ROS    Renal/GU negative Renal ROS     Musculoskeletal  (+) Arthritis , Osteoarthritis,    Abdominal  (+) + obese  Peds  Hematology negative hematology ROS (+)   Anesthesia Other Findings   Reproductive/Obstetrics                             Lab Results  Component Value Date   WBC 6.8 12/28/2017   HGB 12.6 12/28/2017   HCT 39.3 12/28/2017   MCV 101.0 (H) 12/28/2017   PLT 416 (H) 12/28/2017   Lab Results  Component Value Date   INR 0.95 12/28/2017   INR 0.9 12/21/2017     Anesthesia Physical Anesthesia Plan  ASA: 2  Anesthesia Plan: General   Post-op Pain Management: Tylenol  PO (pre-op)*   Induction: Intravenous  PONV Risk Score and Plan: 4 or greater and Ondansetron , Dexamethasone , Treatment may vary due to age or medical condition and Midazolam   Airway Management Planned: Oral ETT  Additional Equipment: None  Intra-op Plan:   Post-operative Plan: Extubation in OR  Informed Consent: I have reviewed the patients History and Physical, chart, labs and discussed the procedure including the risks, benefits and alternatives for the proposed anesthesia with the patient or authorized representative who has indicated his/her understanding and acceptance.     Dental  advisory given  Plan Discussed with: CRNA  Anesthesia Plan Comments:         Anesthesia Quick Evaluation

## 2023-07-19 NOTE — Brief Op Note (Signed)
 07/19/2023  5:04 PM  PATIENT:  Briana Lamb  63 y.o. female  PRE-OPERATIVE DIAGNOSIS:   Vitreous hemorrhage, left eye retinal detachment, left eye  POST-OPERATIVE DIAGNOSIS:  Vitreous hemorrhage, left eye retinal detachment, left eye   PROCEDURE:  Procedure(s): VITRECTOMY, USING 25-GAUGE INSTRUMENTS, WITH SCLERAL BUCKLING (Left)  SURGEON:  Surgeons and Role:    Ronelle Coffee, MD - Primary  ASSISTANTS: Argyle Began, Ophthalmic Assistant    ANESTHESIA:   local and general  EBL:  5 mL   BLOOD ADMINISTERED:none  DRAINS: none   LOCAL MEDICATIONS USED:  MARCAINE    , LIDOCAINE , and Amount: 10 ml  SPECIMEN:  No Specimen  DISPOSITION OF SPECIMEN:  N/A  COUNTS:  YES  TOURNIQUET:  * No tourniquets in log *  DICTATION: .Note written in EPIC  PLAN OF CARE: Discharge to home after PACU  PATIENT DISPOSITION:  PACU - hemodynamically stable.   Delay start of Pharmacological VTE agent (>24hrs) due to surgical blood loss or risk of bleeding: not applicable

## 2023-07-19 NOTE — Interval H&P Note (Signed)
 History and Physical Interval Note:  07/19/2023 12:53 PM  Briana Lamb  has presented today for surgery, with the diagnosis of retinal detachment, left eye.  The various methods of treatment have been discussed with the patient and family. After consideration of risks, benefits and other options for treatment, the patient has consented to  Procedure(s): VITRECTOMY, USING 25-GAUGE INSTRUMENTS, WITH SCLERAL BUCKLING (Left) as a surgical intervention.  The patient's history has been reviewed, patient examined, no change in status, stable for surgery.  I have reviewed the patient's chart and labs.  Questions were answered to the patient's satisfaction.     Ronelle Coffee

## 2023-07-19 NOTE — Anesthesia Procedure Notes (Signed)
 Procedure Name: Intubation Date/Time: 07/19/2023 1:36 PM  Performed by: [address] Myriam Brandhorst A, CRNAPre-anesthesia Checklist: Patient identified, Emergency Drugs available, Suction available, Patient being monitored and Timeout performed Patient Re-evaluated:Patient Re-evaluated prior to induction Oxygen Delivery Method: Circle system utilized Preoxygenation: Pre-oxygenation with 100% oxygen Induction Type: IV induction Laryngoscope Size: Mac and 4 Grade View: Grade I Tube type: Oral Number of attempts: 1 Placement Confirmation: ETT inserted through vocal cords under direct vision, positive ETCO2 and breath sounds checked- equal and bilateral Secured at: 21 cm Tube secured with: Tape

## 2023-07-19 NOTE — Discharge Instructions (Addendum)
POSTOPERATIVE INSTRUCTIONS  Your doctor has performed vitreoretinal surgery on you at [place].  - Keep eye patched and shielded until seen by Dr. Zamora 8 AM tomorrow in clinic - Do not use drops until return - FACE DOWN POSITIONING WHILE AWAKE - Sleep with belly down or on right side, avoid laying flat on back.    - No strenuous bending, stooping or lifting.  - You may not drive until further notice.  - If your doctor used a gas bubble in your eye during the procedure he will advise you on postoperative positioning. If you have a gas bubble you will be wearing a green bracelet that was applied in the operating room. The green bracelet should stay on as long as the gas bubble is in your eye. While the gas bubble is present you should not fly in an airplane. If you require general anesthesia while the gas bubble is present you must notify your anesthesiologist that an intraocular gas bubble is present so he can take the appropriate precautions.  - Tylenol or any other over-the-counter pain reliever can be used according to your doctor. If more pain medicine is required, your doctor will have a prescription for you.  - You may read, go up and down stairs, and watch television.     Brian Zamora, M.D., Ph.D.  

## 2023-07-20 ENCOUNTER — Ambulatory Visit (INDEPENDENT_AMBULATORY_CARE_PROVIDER_SITE_OTHER): Admitting: Ophthalmology

## 2023-07-20 ENCOUNTER — Encounter (INDEPENDENT_AMBULATORY_CARE_PROVIDER_SITE_OTHER): Payer: Self-pay | Admitting: Ophthalmology

## 2023-07-20 DIAGNOSIS — H25813 Combined forms of age-related cataract, bilateral: Secondary | ICD-10-CM

## 2023-07-20 DIAGNOSIS — H35411 Lattice degeneration of retina, right eye: Secondary | ICD-10-CM

## 2023-07-20 DIAGNOSIS — H3322 Serous retinal detachment, left eye: Secondary | ICD-10-CM

## 2023-07-20 DIAGNOSIS — H4312 Vitreous hemorrhage, left eye: Secondary | ICD-10-CM

## 2023-07-20 NOTE — Progress Notes (Signed)
 [facility] - Clinic Note  07/20/2023   CHIEF COMPLAINT Patient presents for Retina Follow Up  HISTORY OF PRESENT ILLNESS: Briana Lamb is a 63 y.o. female who presents to the clinic today for:  HPI     Retina Follow Up   Patient presents with  Retinal Break/Detachment.  In left eye.  Severity is moderate.  Duration of 1 day.  Since onset it is stable.  I, the attending physician,  performed the HPI with the patient and updated documentation appropriately.        Comments   Pt here for 1 day post op s/p PPV C3F8 OS. Pt states she is doing well this morning, glad she has hydrocodone  to help her sleep. A little more pain than she anticipated but she did get some sleep.       Last edited by Ronelle Coffee, MD on 07/21/2023  1:28 AM.     Referring physician: [name], [name], OD [address] [place],  [place] [address]  HISTORICAL INFORMATION:  Selected notes from the MEDICAL RECORD NUMBER Referred by Dr. Micael Adas for concern of vitreous hemorrhage OS LEE:  Ocular Hx- PMH-   CURRENT MEDICATIONS: No current outpatient medications on file. (Ophthalmic Drugs)   No current facility-administered medications for this visit. (Ophthalmic Drugs)   Current Outpatient Medications (Other)  Medication Sig   Ascorbic Acid (VITAMIN C) 1000 MG tablet Take 1,000 mg by mouth daily.   b complex vitamins tablet Take 1 tablet by mouth daily.   HYDROcodone -acetaminophen  (NORCO/VICODIN) 5-325 MG tablet Take 1 tablet by mouth every 4 (four) hours as needed.   L-Arginine 500 MG CAPS Take 500 mg by mouth daily.   L-CITRULLINE PO Take 1 capsule by mouth daily.   Multiple Vitamins-Minerals (MULTIVITAMIN WITH MINERALS) tablet Take 1 tablet by mouth daily.   TURMERIC CURCUMIN PO Take 1 capsule by mouth daily.   No current facility-administered medications for this visit. (Other)   REVIEW OF SYSTEMS: ROS   Positive for: Musculoskeletal, Eyes, Allergic/Imm Negative  for: Constitutional, Gastrointestinal, Neurological, Skin, Genitourinary, HENT, Endocrine, Cardiovascular, Respiratory, Psychiatric, Heme/Lymph Last edited by Anthony Bateman, COT on 07/20/2023  8:01 AM.     ALLERGIES Allergies  Allergen Reactions   Penicillins Swelling and Other (See Comments)    SWELLING REACTION UNSPECIFIED  Has patient had a PCN reaction causing immediate rash, facial/tongue/throat swelling, SOB or lightheadedness with hypotension: unkn Has patient had a PCN reaction causing severe rash involving mucus membranes or skin necrosis: unkn Has patient had a PCN reaction that required hospitalization: unkn Has patient had a PCN reaction occurring within the last 10 years: unkn If all of the above answers are "NO", then may proceed with Cephalosporin use.    Bee Venom Swelling    SWELLING LOCALIZED   PAST MEDICAL HISTORY Past Medical History:  Diagnosis Date   Anxiety    situational per pt (about surgery)   Arthritis    "knees" (01/08/2018)   Bee sting allergy    Seasonal allergies    Past Surgical History:  Procedure Laterality Date   ANKLE FRACTURE SURGERY Right 1995   ANKLE HARDWARE REMOVAL Right ~ 1996   CESAREAN SECTION  1999   FRACTURE SURGERY     JOINT REPLACEMENT     TOTAL KNEE ARTHROPLASTY Left 01/08/2018   TOTAL KNEE ARTHROPLASTY Left 01/08/2018   Procedure: TOTAL KNEE ARTHROPLASTY;  Surgeon: Dayne Even, MD;  Location: [facility] OR;  Service: Orthopedics;  Laterality: Left;  VITRECTOMY 25 GAUGE WITH SCLERAL BUCKLE Left 07/19/2023   Procedure: VITRECTOMY, USING 25-GAUGE INSTRUMENTS, WITH SCLERAL BUCKLING;  Surgeon: Ronelle Coffee, MD;  Location: [facility] OR;  Service: Ophthalmology;  Laterality: Left;   WRIST FRACTURE SURGERY Right 04/2006   FAMILY HISTORY Family History  Problem Relation Age of Onset   Stroke Maternal Grandfather    SOCIAL HISTORY Social History   Tobacco Use   Smoking status: Former    Current packs/day: 0.00    Average  packs/day: 0.5 packs/day for 12.0 years (6.0 ttl pk-yrs)    Types: Cigarettes    Start date: 40    Quit date: 1989    Years since quitting: 36.4   Smokeless tobacco: Never  Vaping Use   Vaping status: Never Used  Substance Use Topics   Alcohol use: Yes    Comment: socially- maybe once a month   Drug use: Never       OPHTHALMIC EXAM:  Base Eye Exam     Visual Acuity (Snellen - Linear)       Right Left   Dist cc 20/20 HM         Tonometry (Tonopen, 8:13 AM)       Right Left   Pressure 18 17         Pupils       Dark Light Shape React APD   Right 3 2 Round Brisk None   Left 6 6 Dilated NR None         Visual Fields (Counting fingers)       Left Right     Full   Restrictions Partial outer superior temporal, inferior temporal, superior nasal, inferior nasal deficiencies          Extraocular Movement       Right Left    Full, Ortho Full, Ortho         Neuro/Psych     Oriented x3: Yes   Mood/Affect: Normal         Dilation     Both eyes: 1.0% Mydriacyl, 2.5% Phenylephrine  @ 8:16 AM           Slit Lamp and Fundus Exam     External Exam       Right Left   External  Mild lid edema, periorbital edema         Slit Lamp Exam       Right Left   Lids/Lashes Dermatochalasis - upper lid Dermatochalasis - upper lid   Conjunctiva/Sclera Small nasal pingeucula temporal pinguecula, Subconjunctival hemorrhage, Suture intact   Cornea trace PEE mild arcus, trace PEE, Descemet's folds, central defect   Anterior Chamber deep, clear, narrow temporal angle 2-3+ cell, early fibroin reaction   Iris Round and dilated, No NVI Round and dilated, No NVI   Lens 2+ Nuclear sclerosis, 2+ Cortical cataract 2+ Nuclear sclerosis, 2+ Cortical cataract, 2+ PC feathering   Anterior Vitreous syneresis, vitreous condensations Post vitrectomy, good gas fill         Fundus Exam       Right Left   Disc Pink and Sharp, temporal PPA Hazy view   C/D Ratio 0.3     Macula Flat, Good foveal reflex, RPE mottling, No heme or edema Hazy view, grossly attached under gas   Vessels attenuated, Tortuous no view   Periphery Attached, pigmented lattice at 0630 hazy view, retina attached over buckle, good buckle height, good laser over buckle           IMAGING  AND PROCEDURES  Imaging and Procedures for 07/20/2023          ASSESSMENT/PLAN:   ICD-10-CM   1. Left retinal detachment  H33.22     2. 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 Combined forms of age-related cataract of both eyes  H25.813      Rhegmatogenous retinal detachment, OS - bullous total retinal detachment w/ small retinal breaks at 1030 hidden behind vit heorrhage - now POD1 s/p PPV/POC/EL/FAX/14% C3F8 OD, 06.21.2021 - doing well this morning - retina attached and in good position -- good buckle height and laser around breaks - IOP 17 - start PF 4x/day OD  Zymaxid QID OD  Atropine BID OD  PSO ung QID OD  - cont face down positioning x3 days; avoid laying flat on back  - eye shield when sleeping  - post op drop and positioning instructions reviewed  - tylenol /ibuprofen for pain  - Rx given for breakthrough pain  - f/u 1 wk - Thurs or Friday, DFE  2. Vitreous hemorrhage OS  - VH onset Friday, 04.18.25 while vacationing in [place]  - s/p IVA OS #1 (04.22.25) -- no improvement  - now s/p PPV OD as above  - VH cleared post vitrectomy  3. Lattice degeneration right eye - small patch of pigmented lattice at 0630 - asymptomatic - discussed findings, prognosis, and treatment options including observation - recommend laser retinopexy OD -- but will have to schedule procedure once laser comes in  4. Mixed Cataract OU - The symptoms of cataract, surgical options, and treatments and risks were discussed with patient. - discussed diagnosis and progression - monitor  Ophthalmic Meds Ordered this visit:  No  orders of the defined types were placed in this encounter.    Return in about 1 week (around 07/27/2023) for f/u RD OS , DFE, POV.  There are no Patient Instructions on file for this visit.  Explained the diagnoses, plan, and follow up with the patient and they expressed understanding.  Patient expressed understanding of the importance of proper follow up care.   This document serves as a record of services personally performed by Jeanice Millard, MD, PhD. It was created on their behalf by Olene Berne, COT an ophthalmic technician. The creation of this record is the provider's dictation and/or activities during the visit.    Electronically signed by:  Olene Berne, COT  07/21/23 1:29 AM  Jeanice Millard, M.D., Ph.D. Diseases & Surgery of the Retina and Vitreous [facility] 07/20/2023  I have reviewed the above documentation for accuracy and completeness, and I agree with the above. Jeanice Millard, M.D., Ph.D. 07/21/23 1:37 AM   Abbreviations: M myopia (nearsighted); A astigmatism; H hyperopia (farsighted); P presbyopia; Mrx spectacle prescription;  CTL contact lenses; OD right eye; OS left eye; OU both eyes  XT exotropia; ET esotropia; PEK punctate epithelial keratitis; PEE punctate epithelial erosions; DES dry eye syndrome; MGD meibomian gland dysfunction; ATs artificial tears; PFAT's preservative free artificial tears; NSC nuclear sclerotic cataract; PSC posterior subcapsular cataract; ERM epi-retinal membrane; PVD posterior vitreous detachment; RD retinal detachment; DM diabetes mellitus; DR diabetic retinopathy; NPDR non-proliferative diabetic retinopathy; PDR proliferative diabetic retinopathy; CSME clinically significant macular edema; DME diabetic macular edema; dbh dot blot hemorrhages; CWS cotton wool spot; POAG primary open angle glaucoma; C/D cup-to-disc ratio; HVF humphrey visual field;  GVF goldmann visual field; OCT optical coherence tomography;  IOP intraocular pressure; BRVO Branch retinal vein occlusion; CRVO central retinal vein occlusion; CRAO central retinal artery occlusion; BRAO branch retinal artery occlusion; RT retinal tear; SB scleral buckle; PPV pars plana vitrectomy; VH Vitreous hemorrhage; PRP panretinal laser photocoagulation; IVK intravitreal kenalog; VMT vitreomacular traction; MH Macular hole;  NVD neovascularization of the disc; NVE neovascularization elsewhere; AREDS age related eye disease study; ARMD age related macular degeneration; POAG primary open angle glaucoma; EBMD epithelial/anterior basement membrane dystrophy; ACIOL anterior chamber intraocular lens; IOL intraocular lens; PCIOL posterior chamber intraocular lens; Phaco/IOL phacoemulsification with intraocular lens placement; PRK photorefractive keratectomy; LASIK laser assisted in situ keratomileusis; HTN hypertension; DM diabetes mellitus; COPD chronic obstructive pulmonary disease

## 2023-07-21 ENCOUNTER — Encounter (INDEPENDENT_AMBULATORY_CARE_PROVIDER_SITE_OTHER): Payer: Self-pay | Admitting: Ophthalmology

## 2023-07-24 NOTE — Progress Notes (Shared)
 [facility] - Clinic Note  07/26/2023   CHIEF COMPLAINT Patient presents for Retina Follow Up  HISTORY OF PRESENT ILLNESS: Briana Lamb is a 63 y.o. female who presents to the clinic today for:  HPI     Retina Follow Up   Patient presents with  Retinal Break/Detachment.  In left eye.  This started 1 week ago.  Severity is moderate.  Duration of 1 week.  Since onset it is gradually improving.  I, the attending physician,  performed the HPI with the patient and updated documentation appropriately.        Comments   Pt presents for 1 week s/p PPV C3F8 OS. Pt states she has still been sleeping with her head down on the table while sleeping. Pt is using PF 6x/day OD, Zymaxid QID OD, Atropine BID OD, PSO ung TID OD. Pt states this week she has been using hydrocodone  just to got to sleep but did not need it last night. Pt denies FOL/floaters. Pt states slight improvement in vision but things are still very opaque.       Last edited by Ronelle Coffee, MD on 07/26/2023 10:49 AM.    Pt states she is getting a little better every day, she is keeping her face down 50/60 mins  Referring physician: [name], [name], OD [address] [place]on [place],  [place] [address]  HISTORICAL INFORMATION:  Selected notes from the MEDICAL RECORD NUMBER Referred by Dr. Micael Adas for concern of vitreous hemorrhage OS LEE:  Ocular Hx- PMH-   CURRENT MEDICATIONS: Current Outpatient Medications (Ophthalmic Drugs)  Medication Sig   bacitracin-polymyxin b (POLYSPORIN) ophthalmic ointment Place into the right eye 4 (four) times daily. Place a 1/2 inch ribbon of ointment into the lower eyelid.   prednisoLONE acetate (PRED FORTE) 1 % ophthalmic suspension Place 1 drop into the left eye 4 (four) times daily.   No current facility-administered medications for this visit. (Ophthalmic Drugs)   Current Outpatient Medications (Other)  Medication Sig   Ascorbic Acid (VITAMIN C) 1000 MG tablet  Take 1,000 mg by mouth daily.   b complex vitamins tablet Take 1 tablet by mouth daily.   HYDROcodone -acetaminophen  (NORCO/VICODIN) 5-325 MG tablet Take 1 tablet by mouth every 4 (four) hours as needed.   L-Arginine 500 MG CAPS Take 500 mg by mouth daily.   L-CITRULLINE PO Take 1 capsule by mouth daily.   Multiple Vitamins-Minerals (MULTIVITAMIN WITH MINERALS) tablet Take 1 tablet by mouth daily.   TURMERIC CURCUMIN PO Take 1 capsule by mouth daily.   No current facility-administered medications for this visit. (Other)   REVIEW OF SYSTEMS: ROS   Positive for: Musculoskeletal, Eyes, Allergic/Imm Negative for: Constitutional, Gastrointestinal, Neurological, Skin, Genitourinary, HENT, Endocrine, Cardiovascular, Respiratory, Psychiatric, Heme/Lymph Last edited by Carrington Clack, COT on 07/26/2023  8:04 AM.      ALLERGIES Allergies  Allergen Reactions   Penicillins Swelling and Other (See Comments)    SWELLING REACTION UNSPECIFIED  Has patient had a PCN reaction causing immediate rash, facial/tongue/throat swelling, SOB or lightheadedness with hypotension: unkn Has patient had a PCN reaction causing severe rash involving mucus membranes or skin necrosis: unkn Has patient had a PCN reaction that required hospitalization: unkn Has patient had a PCN reaction occurring within the last 10 years: unkn If all of the above answers are "NO", then may proceed with Cephalosporin use.    Bee Venom Swelling    SWELLING LOCALIZED   PAST MEDICAL HISTORY Past Medical History:  Diagnosis Date   Anxiety    situational per pt (about surgery)   Arthritis    "knees" (01/08/2018)   Bee sting allergy    Seasonal allergies    Past Surgical History:  Procedure Laterality Date   ANKLE FRACTURE SURGERY Right 1995   ANKLE HARDWARE REMOVAL Right ~ 1996   CESAREAN SECTION  1999   FRACTURE SURGERY     JOINT REPLACEMENT     TOTAL KNEE ARTHROPLASTY Left 01/08/2018   TOTAL KNEE ARTHROPLASTY Left  01/08/2018   Procedure: TOTAL KNEE ARTHROPLASTY;  Surgeon: Dayne Even, MD;  Location: [facility] OR;  Service: Orthopedics;  Laterality: Left;   VITRECTOMY 25 GAUGE WITH SCLERAL BUCKLE Left 07/19/2023   Procedure: VITRECTOMY, USING 25-GAUGE INSTRUMENTS, WITH SCLERAL BUCKLING;  Surgeon: Ronelle Coffee, MD;  Location: [facility] OR;  Service: Ophthalmology;  Laterality: Left;   WRIST FRACTURE SURGERY Right 04/2006   FAMILY HISTORY Family History  Problem Relation Age of Onset   Stroke Maternal Grandfather    SOCIAL HISTORY Social History   Tobacco Use   Smoking status: Former    Current packs/day: 0.00    Average packs/day: 0.5 packs/day for 12.0 years (6.0 ttl pk-yrs)    Types: Cigarettes    Start date: 57    Quit date: 1989    Years since quitting: 36.4   Smokeless tobacco: Never  Vaping Use   Vaping status: Never Used  Substance Use Topics   Alcohol use: Yes    Comment: socially- maybe once a month   Drug use: Never       OPHTHALMIC EXAM:  Base Eye Exam     Visual Acuity (Snellen - Linear)       Right Left   Dist cc 20/20 HM   Dist ph cc  NI    Correction: Glasses         Tonometry (Tonopen, 8:17 AM)       Right Left   Pressure 18 19         Pupils       Pupils Dark Light Shape React APD   Right PERRL 3 2 Round Brisk unable to assess   Left PERRL 6 6 Round None None         Visual Fields       Left Right    Full    Restrictions  Total superior temporal, inferior temporal, superior nasal, inferior nasal deficiencies         Extraocular Movement       Right Left    Full, Ortho Full, Ortho         Neuro/Psych     Oriented x3: Yes   Mood/Affect: Normal         Dilation     Both eyes: 1.0% Mydriacyl, 2.5% Phenylephrine  @ 8:20 AM           Slit Lamp and Fundus Exam     External Exam       Right Left   External  Mild lid edema, periorbital edema         Slit Lamp Exam       Right Left   Lids/Lashes Dermatochalasis - upper  lid Dermatochalasis - upper lid   Conjunctiva/Sclera Small nasal pingeucula temporal pinguecula, Subconjunctival hemorrhage -- improving, Suture intact   Cornea trace PEE mild arcus, 2+PEE, central defect -- closed   Anterior Chamber deep, clear, narrow temporal angle Deep, narrow angles, 0.5+cell / pigment   Iris Round and dilated, No NVI  Round and dilated, No NVI   Lens 2+ Nuclear sclerosis, 2+ Cortical cataract 2+ Nuclear sclerosis, 2+ Cortical cataract, 1-2+PSC   Anterior Vitreous syneresis, vitreous condensations Post vitrectomy, VH cleared, good gas fill         Fundus Exam       Right Left   Disc Pink and Sharp, temporal PPA Sharp rim, perfused   C/D Ratio 0.3    Macula Flat, Good foveal reflex, RPE mottling, No heme or edema Flat under gas   Vessels attenuated, Tortuous no view   Periphery Attached, pigmented lattice at 0630 retina attached over buckle, good buckle height, good laser over buckle and surrounding retinal breaks at 1030           Refraction     Wearing Rx       Sphere Cylinder Axis Add   Right -3.00 +1.75 035 +2.50   Left -0.75 +0.75 088 +2.50    Type: Progressive           IMAGING AND PROCEDURES  Imaging and Procedures for 07/26/2023          ASSESSMENT/PLAN:   ICD-10-CM   1. Left retinal detachment  H33.22     2. 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 Combined forms of age-related cataract of both eyes  H25.813       Rhegmatogenous retinal detachment, OS - bullous total retinal detachment w/ small retinal breaks at 1030 hidden behind vit heorrhage - now POW1 s/p PPV/POC/EL/FAX/14% C3F8 OS, 06.21.2021 - doing well - retina attached and in good position -- good buckle height and laser around breaks - IOP 17 - cont PF 4x/day OS  Zymaxid QID OS -- stop on Sunday Atropine BID OS -- stop now PSO ung QID OS  - cont face down positioning 50/60 mins/hr x7 more days,  then can decrease to 30 min/hr - avoid laying flat on back  - eye shield when sleeping x1 more week - post op drop and positioning instructions reviewed  - tylenol /ibuprofen for pain  - Rx given for breakthrough pain  - f/u 2-3 weeks -- POV/ DFE  2. Vitreous hemorrhage OS  - VH onset Friday, 04.18.25 while vacationing in [place]  - s/p IVA OS #1 (04.22.25) -- no improvement  - now s/p PPV OS as above  - VH cleared post-vitrectomy  3. Lattice degeneration right eye - small patch of pigmented lattice at 0630 - asymptomatic - discussed findings, prognosis, and treatment options including observation - recommend laser retinopexy OD--but will schedule once vision improves OS  4. Mixed Cataract OU - The symptoms of cataract, surgical options, and treatments and risks were discussed with patient. - discussed diagnosis and progression - monitor  Ophthalmic Meds Ordered this visit:  Meds ordered this encounter  Medications   prednisoLONE acetate (PRED FORTE) 1 % ophthalmic suspension    Sig: Place 1 drop into the left eye 4 (four) times daily.    Dispense:  15 mL    Refill:  0   bacitracin-polymyxin b (POLYSPORIN) ophthalmic ointment    Sig: Place into the right eye 4 (four) times daily. Place a 1/2 inch ribbon of ointment into the lower eyelid.    Dispense:  3.5 g    Refill:  5     Return in about 3 weeks (around 08/16/2023) for f/u RD OS, DFE, OCT.  There are no  Patient Instructions on file for this visit.  Explained the diagnoses, plan, and follow up with the patient and they expressed understanding.  Patient expressed understanding of the importance of proper follow up care.   This document serves as a record of services personally performed by Jeanice Millard, MD, PhD. It was created on their behalf by Diona Franklin, COMT. The creation of this record is the provider's dictation and/or activities during the visit.  Electronically signed by: Diona Franklin, COMT 07/26/23 10:49  AM  This document serves as a record of services personally performed by Jeanice Millard, MD, PhD. It was created on their behalf by Morley Arabia. Bevin Bucks, OA an ophthalmic technician. The creation of this record is the provider's dictation and/or activities during the visit.    Electronically signed by: Morley Arabia. Bevin Bucks, OA 07/26/23 10:49 AM  Jeanice Millard, M.D., Ph.D. Diseases & Surgery of the Retina and Vitreous [facility]  I have reviewed the above documentation for accuracy and completeness, and I agree with the above. Jeanice Millard, M.D., Ph.D. 07/26/23 10:51 AM   Abbreviations: M myopia (nearsighted); A astigmatism; H hyperopia (farsighted); P presbyopia; Mrx spectacle prescription;  CTL contact lenses; OD right eye; OS left eye; OU both eyes  XT exotropia; ET esotropia; PEK punctate epithelial keratitis; PEE punctate epithelial erosions; DES dry eye syndrome; MGD meibomian gland dysfunction; ATs artificial tears; PFAT's preservative free artificial tears; NSC nuclear sclerotic cataract; PSC posterior subcapsular cataract; ERM epi-retinal membrane; PVD posterior vitreous detachment; RD retinal detachment; DM diabetes mellitus; DR diabetic retinopathy; NPDR non-proliferative diabetic retinopathy; PDR proliferative diabetic retinopathy; CSME clinically significant macular edema; DME diabetic macular edema; dbh dot blot hemorrhages; CWS cotton wool spot; POAG primary open angle glaucoma; C/D cup-to-disc ratio; HVF humphrey visual field; GVF goldmann visual field; OCT optical coherence tomography; IOP intraocular pressure; BRVO Branch retinal vein occlusion; CRVO central retinal vein occlusion; CRAO central retinal artery occlusion; BRAO branch retinal artery occlusion; RT retinal tear; SB scleral buckle; PPV pars plana vitrectomy; VH Vitreous hemorrhage; PRP panretinal laser photocoagulation; IVK intravitreal kenalog; VMT vitreomacular traction; MH Macular hole;  NVD  neovascularization of the disc; NVE neovascularization elsewhere; AREDS age related eye disease study; ARMD age related macular degeneration; POAG primary open angle glaucoma; EBMD epithelial/anterior basement membrane dystrophy; ACIOL anterior chamber intraocular lens; IOL intraocular lens; PCIOL posterior chamber intraocular lens; Phaco/IOL phacoemulsification with intraocular lens placement; PRK photorefractive keratectomy; LASIK laser assisted in situ keratomileusis; HTN hypertension; DM diabetes mellitus; COPD chronic obstructive pulmonary disease

## 2023-07-24 NOTE — Anesthesia Postprocedure Evaluation (Signed)
 Anesthesia Post Note  Patient: Briana Lamb  Procedure(s) Performed: VITRECTOMY, USING 25-GAUGE INSTRUMENTS, WITH SCLERAL BUCKLING (Left)     Patient location during evaluation: PACU Anesthesia Type: General Level of consciousness: awake and alert Pain management: pain level controlled Vital Signs Assessment: post-procedure vital signs reviewed and stable Respiratory status: spontaneous breathing, nonlabored ventilation and respiratory function stable Cardiovascular status: stable and blood pressure returned to baseline Anesthetic complications: no   No notable events documented.  Last Vitals:  Vitals:   07/19/23 1718 07/19/23 1733  BP: (!) 121/55 125/62  Pulse: 78 79  Resp: (!) 8 12  Temp:  36.9 C  SpO2: 91% 92%    Last Pain:  Vitals:   07/19/23 1733  TempSrc:   PainSc: 0-No pain                 Juventino Oppenheim

## 2023-07-26 ENCOUNTER — Ambulatory Visit (INDEPENDENT_AMBULATORY_CARE_PROVIDER_SITE_OTHER): Admitting: Ophthalmology

## 2023-07-26 ENCOUNTER — Encounter (INDEPENDENT_AMBULATORY_CARE_PROVIDER_SITE_OTHER): Payer: Self-pay | Admitting: Ophthalmology

## 2023-07-26 DIAGNOSIS — H4312 Vitreous hemorrhage, left eye: Secondary | ICD-10-CM

## 2023-07-26 DIAGNOSIS — H35411 Lattice degeneration of retina, right eye: Secondary | ICD-10-CM

## 2023-07-26 DIAGNOSIS — H25813 Combined forms of age-related cataract, bilateral: Secondary | ICD-10-CM

## 2023-07-26 DIAGNOSIS — H3322 Serous retinal detachment, left eye: Secondary | ICD-10-CM

## 2023-07-26 MED ORDER — PREDNISOLONE ACETATE 1 % OP SUSP: 1.0000 [drp] | Freq: Four times a day (QID) | OPHTHALMIC | 0 refills | Status: AC

## 2023-07-26 MED ORDER — BACITRACIN-POLYMYXIN B 500-10000 UNIT/GM OP OINT: TOPICAL_OINTMENT | Freq: Four times a day (QID) | OPHTHALMIC | 5 refills | Status: AC

## 2023-08-03 NOTE — Progress Notes (Deleted)
 [facility] - Clinic Note  08/16/2023   CHIEF COMPLAINT Patient presents for No chief complaint on file.  HISTORY OF PRESENT ILLNESS: Briana Lamb is a 63 y.o. female who presents to the clinic today for:   Pt states she is getting a little better every day, she is keeping her face down 50/60 mins  Referring physician: [name], [name], OD [address] [place],  [place] [address]  HISTORICAL INFORMATION:  Selected notes from the MEDICAL RECORD NUMBER Referred by Dr. Micael Adas for concern of vitreous hemorrhage OS LEE:  Ocular Hx- PMH-   CURRENT MEDICATIONS: Current Outpatient Medications (Ophthalmic Drugs)  Medication Sig   bacitracin -polymyxin b  (POLYSPORIN ) ophthalmic ointment Place into the right eye 4 (four) times daily. Place a 1/2 inch ribbon of ointment into the lower eyelid.   prednisoLONE  acetate (PRED FORTE ) 1 % ophthalmic suspension Place 1 drop into the left eye 4 (four) times daily.   No current facility-administered medications for this visit. (Ophthalmic Drugs)   Current Outpatient Medications (Other)  Medication Sig   Ascorbic Acid (VITAMIN C) 1000 MG tablet Take 1,000 mg by mouth daily.   b complex vitamins tablet Take 1 tablet by mouth daily.   HYDROcodone -acetaminophen  (NORCO/VICODIN) 5-325 MG tablet Take 1 tablet by mouth every 4 (four) hours as needed.   L-Arginine 500 MG CAPS Take 500 mg by mouth daily.   L-CITRULLINE PO Take 1 capsule by mouth daily.   Multiple Vitamins-Minerals (MULTIVITAMIN WITH MINERALS) tablet Take 1 tablet by mouth daily.   TURMERIC CURCUMIN PO Take 1 capsule by mouth daily.   No current facility-administered medications for this visit. (Other)   REVIEW OF SYSTEMS:    ALLERGIES Allergies  Allergen Reactions   Penicillins Swelling and Other (See Comments)    SWELLING REACTION UNSPECIFIED  Has patient had a PCN reaction causing immediate rash, facial/tongue/throat swelling, SOB or  lightheadedness with hypotension: unkn Has patient had a PCN reaction causing severe rash involving mucus membranes or skin necrosis: unkn Has patient had a PCN reaction that required hospitalization: unkn Has patient had a PCN reaction occurring within the last 10 years: unkn If all of the above answers are NO, then may proceed with Cephalosporin use.    Bee Venom Swelling    SWELLING LOCALIZED   PAST MEDICAL HISTORY Past Medical History:  Diagnosis Date   Anxiety    situational per pt (about surgery)   Arthritis    knees (01/08/2018)   Bee sting allergy    Seasonal allergies    Past Surgical History:  Procedure Laterality Date   ANKLE FRACTURE SURGERY Right 1995   ANKLE HARDWARE REMOVAL Right ~ 1996   CESAREAN SECTION  1999   FRACTURE SURGERY     JOINT REPLACEMENT     TOTAL KNEE ARTHROPLASTY Left 01/08/2018   TOTAL KNEE ARTHROPLASTY Left 01/08/2018   Procedure: TOTAL KNEE ARTHROPLASTY;  Surgeon: Dayne Even, MD;  Location: [facility] OR;  Service: Orthopedics;  Laterality: Left;   VITRECTOMY 25 GAUGE WITH SCLERAL BUCKLE Left 07/19/2023   Procedure: VITRECTOMY, USING 25-GAUGE INSTRUMENTS, WITH SCLERAL BUCKLING;  Surgeon: Ronelle Coffee, MD;  Location: [facility] OR;  Service: Ophthalmology;  Laterality: Left;   WRIST FRACTURE SURGERY Right 04/2006   FAMILY HISTORY Family History  Problem Relation Age of Onset   Stroke Maternal Grandfather    SOCIAL HISTORY Social History   Tobacco Use   Smoking status: Former    Current packs/day: 0.00    Average packs/day:  0.5 packs/day for 12.0 years (6.0 ttl pk-yrs)    Types: Cigarettes    Start date: 28    Quit date: 1989    Years since quitting: 36.4   Smokeless tobacco: Never  Vaping Use   Vaping status: Never Used  Substance Use Topics   Alcohol use: Yes    Comment: socially- maybe once a month   Drug use: Never       OPHTHALMIC EXAM:  Not recorded    IMAGING AND PROCEDURES  Imaging and Procedures for 08/16/2023           ASSESSMENT/PLAN:   ICD-10-CM   1. Left retinal detachment  H33.22     2. 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 Combined forms of age-related cataract of both eyes  H25.813       Rhegmatogenous retinal detachment, OS - bullous total retinal detachment w/ small retinal breaks at 1030 hidden behind vit heorrhage - now POW4 s/p PPV/POC/EL/FAX/14% C3F8 OS, 05.22.2025 - doing well - retina attached and in good position -- good buckle height and laser around breaks - IOP 17 - cont PF 4x/day OS  Zymaxid QID OS -- stop on Sunday Atropine  BID OS -- stop now PSO ung QID OS  - cont face down positioning 50/60 mins/hr x7 more days, then can decrease to 30 min/hr - avoid laying flat on back  - eye shield when sleeping x1 more week - post op drop and positioning instructions reviewed  - tylenol /ibuprofen for pain  - Rx given for breakthrough pain  - f/u 2-3 weeks -- POV/ DFE  2. Vitreous hemorrhage OS  - VH onset Friday, 04.18.25 while vacationing in [place]  - s/p IVA OS #1 (04.22.25) -- no improvement  - now s/p PPV OS as above  - VH cleared post-vitrectomy  3. Lattice degeneration right eye - small patch of pigmented lattice at 0630 - asymptomatic - discussed findings, prognosis, and treatment options including observation - recommend laser retinopexy OD--but will schedule once vision improves OS  4. Mixed Cataract OU - The symptoms of cataract, surgical options, and treatments and risks were discussed with patient. - discussed diagnosis and progression - monitor  Ophthalmic Meds Ordered this visit:  No orders of the defined types were placed in this encounter.    No follow-ups on file.  There are no Patient Instructions on file for this visit.  Explained the diagnoses, plan, and follow up with the patient and they expressed understanding.  Patient expressed understanding of the importance of  proper follow up care.   This document serves as a record of services personally performed by Jeanice Millard, MD, PhD. It was created on their behalf by Morley Arabia. Bevin Bucks, OA an ophthalmic technician. The creation of this record is the provider's dictation and/or activities during the visit.    Electronically signed by: Morley Arabia. Bevin Bucks, OA 08/03/23 12:22 PM   Jeanice Millard, M.D., Ph.D. Diseases & Surgery of the Retina and Vitreous [facility]    Abbreviations: M myopia (nearsighted); A astigmatism; H hyperopia (farsighted); P presbyopia; Mrx spectacle prescription;  CTL contact lenses; OD right eye; OS left eye; OU both eyes  XT exotropia; ET esotropia; PEK punctate epithelial keratitis; PEE punctate epithelial erosions; DES dry eye syndrome; MGD meibomian gland dysfunction; ATs artificial tears; PFAT's preservative free artificial tears; NSC nuclear sclerotic cataract;  PSC posterior subcapsular cataract; ERM epi-retinal membrane; PVD posterior vitreous detachment; RD retinal detachment; DM diabetes mellitus; DR diabetic retinopathy; NPDR non-proliferative diabetic retinopathy; PDR proliferative diabetic retinopathy; CSME clinically significant macular edema; DME diabetic macular edema; dbh dot blot hemorrhages; CWS cotton wool spot; POAG primary open angle glaucoma; C/D cup-to-disc ratio; HVF humphrey visual field; GVF goldmann visual field; OCT optical coherence tomography; IOP intraocular pressure; BRVO Branch retinal vein occlusion; CRVO central retinal vein occlusion; CRAO central retinal artery occlusion; BRAO branch retinal artery occlusion; RT retinal tear; SB scleral buckle; PPV pars plana vitrectomy; VH Vitreous hemorrhage; PRP panretinal laser photocoagulation; IVK intravitreal kenalog ; VMT vitreomacular traction; MH Macular hole;  NVD neovascularization of the disc; NVE neovascularization elsewhere; AREDS age related eye disease study; ARMD age related macular  degeneration; POAG primary open angle glaucoma; EBMD epithelial/anterior basement membrane dystrophy; ACIOL anterior chamber intraocular lens; IOL intraocular lens; PCIOL posterior chamber intraocular lens; Phaco/IOL phacoemulsification with intraocular lens placement; PRK photorefractive keratectomy; LASIK laser assisted in situ keratomileusis; HTN hypertension; DM diabetes mellitus; COPD chronic obstructive pulmonary disease

## 2023-08-16 ENCOUNTER — Encounter (INDEPENDENT_AMBULATORY_CARE_PROVIDER_SITE_OTHER): Payer: Self-pay

## 2023-08-16 ENCOUNTER — Encounter (INDEPENDENT_AMBULATORY_CARE_PROVIDER_SITE_OTHER): Admitting: Ophthalmology

## 2023-08-16 DIAGNOSIS — H35411 Lattice degeneration of retina, right eye: Secondary | ICD-10-CM

## 2023-08-16 DIAGNOSIS — H4312 Vitreous hemorrhage, left eye: Secondary | ICD-10-CM

## 2023-08-16 DIAGNOSIS — H3322 Serous retinal detachment, left eye: Secondary | ICD-10-CM

## 2023-08-16 DIAGNOSIS — H25813 Combined forms of age-related cataract, bilateral: Secondary | ICD-10-CM

## 2023-08-16 NOTE — Progress Notes (Signed)
 [facility] - Clinic Note  08/17/2023   CHIEF COMPLAINT Patient presents for Post-op Follow-up  HISTORY OF PRESENT ILLNESS: Briana Lamb is a 63 y.o. female who presents to the clinic today for:  HPI     Post-op Follow-up           Laterality: left eye   Discomfort: itching and discharge.  Negative for pain, foreign body sensation, tearing and floaters   Vision: is improved         Comments   4 week s/p Rd. Patient states vision has improved, looks like a water  globe in vision      Last edited by Leonia Raman, COT on 08/17/2023  8:14 AM.     Pt states her vision is improving, she sees a water  glob sloshing in her left eye, she is struggling to keep her head down  Referring physician: [name], [name], OD [address] [place],  [place] [address]  HISTORICAL INFORMATION:  Selected notes from the MEDICAL RECORD NUMBER Referred by Dr. Micael Adas for concern of vitreous hemorrhage OS LEE:  Ocular Hx- PMH-   CURRENT MEDICATIONS: Current Outpatient Medications (Ophthalmic Drugs)  Medication Sig   bacitracin -polymyxin b  (POLYSPORIN ) ophthalmic ointment Place into the right eye 4 (four) times daily. Place a 1/2 inch ribbon of ointment into the lower eyelid.   prednisoLONE  acetate (PRED FORTE ) 1 % ophthalmic suspension Place 1 drop into the left eye 4 (four) times daily.   No current facility-administered medications for this visit. (Ophthalmic Drugs)   Current Outpatient Medications (Other)  Medication Sig   Ascorbic Acid (VITAMIN C) 1000 MG tablet Take 1,000 mg by mouth daily.   b complex vitamins tablet Take 1 tablet by mouth daily.   HYDROcodone -acetaminophen  (NORCO/VICODIN) 5-325 MG tablet Take 1 tablet by mouth every 4 (four) hours as needed.   L-Arginine 500 MG CAPS Take 500 mg by mouth daily.   L-CITRULLINE PO Take 1 capsule by mouth daily.   Multiple Vitamins-Minerals (MULTIVITAMIN WITH MINERALS) tablet Take 1 tablet by mouth  daily.   TURMERIC CURCUMIN PO Take 1 capsule by mouth daily.   No current facility-administered medications for this visit. (Other)   REVIEW OF SYSTEMS: ROS   Positive for: Musculoskeletal, Eyes, Allergic/Imm Negative for: Constitutional, Gastrointestinal, Neurological, Skin, Genitourinary, HENT, Endocrine, Cardiovascular, Respiratory, Psychiatric, Heme/Lymph Last edited by Leonia Raman, COT on 08/17/2023  8:05 AM.       ALLERGIES Allergies  Allergen Reactions   Penicillins Swelling and Other (See Comments)    SWELLING REACTION UNSPECIFIED  Has patient had a PCN reaction causing immediate rash, facial/tongue/throat swelling, SOB or lightheadedness with hypotension: unkn Has patient had a PCN reaction causing severe rash involving mucus membranes or skin necrosis: unkn Has patient had a PCN reaction that required hospitalization: unkn Has patient had a PCN reaction occurring within the last 10 years: unkn If all of the above answers are NO, then may proceed with Cephalosporin use.    Bee Venom Swelling    SWELLING LOCALIZED   PAST MEDICAL HISTORY Past Medical History:  Diagnosis Date   Anxiety    situational per pt (about surgery)   Arthritis    knees (01/08/2018)   Bee sting allergy    Seasonal allergies    Past Surgical History:  Procedure Laterality Date   ANKLE FRACTURE SURGERY Right 1995   ANKLE HARDWARE REMOVAL Right ~ 1996   CESAREAN SECTION  1999   FRACTURE SURGERY  JOINT REPLACEMENT     TOTAL KNEE ARTHROPLASTY Left 01/08/2018   TOTAL KNEE ARTHROPLASTY Left 01/08/2018   Procedure: TOTAL KNEE ARTHROPLASTY;  Surgeon: Dayne Even, MD;  Location: [facility] OR;  Service: Orthopedics;  Laterality: Left;   VITRECTOMY 25 GAUGE WITH SCLERAL BUCKLE Left 07/19/2023   Procedure: VITRECTOMY, USING 25-GAUGE INSTRUMENTS, WITH SCLERAL BUCKLING;  Surgeon: Ronelle Coffee, MD;  Location: [facility] OR;  Service: Ophthalmology;  Laterality: Left;   WRIST FRACTURE SURGERY Right 04/2006    FAMILY HISTORY Family History  Problem Relation Age of Onset   Stroke Maternal Grandfather    SOCIAL HISTORY Social History   Tobacco Use   Smoking status: Former    Current packs/day: 0.00    Average packs/day: 0.5 packs/day for 12.0 years (6.0 ttl pk-yrs)    Types: Cigarettes    Start date: 41    Quit date: 1989    Years since quitting: 36.4   Smokeless tobacco: Never  Vaping Use   Vaping status: Never Used  Substance Use Topics   Alcohol use: Yes    Comment: socially- maybe once a month   Drug use: Never       OPHTHALMIC EXAM:  Base Eye Exam     Visual Acuity (Snellen - Linear)       Right Left   Dist cc 20/20 -2 20/CF   Dist ph cc  20/150    Correction: Glasses         Tonometry (Tonopen, 8:12 AM)       Right Left   Pressure  8         Pupils       Dark Light Shape React APD   Right        Left 3 2 Round Minimal None         Dilation     Left eye: 1.0% Mydriacyl , 2.5% Phenylephrine  @ 8:12 AM           IMAGING AND PROCEDURES  Imaging and Procedures for 08/17/2023          ASSESSMENT/PLAN:   ICD-10-CM   1. Left retinal detachment  H33.22 OCT, Retina - OU - Both Eyes    2. 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 Combined forms of age-related cataract of both eyes  H25.813       Rhegmatogenous retinal detachment, OS - bullous total retinal detachment w/ small retinal breaks at 1030 hidden behind vit heorrhage - now POW4 s/p PPV/POC/EL/FAX/14% C3F8 OS, 05.22.2025 - doing well - retina attached and in good position -- good buckle height and laser around breaks - IOP 08 - cont PF 4x/day OS PSO ung QID OS -- at bedtime / PRN - start AT's QID - cont face down positioning 30/60 mins/hr - avoid laying flat on back  - eye shield when sleeping x1 more week - post op drop and positioning instructions reviewed  - tylenol /ibuprofen for pain  - Rx given for  breakthrough pain  - f/u 2-3 weeks -- POV/ DFE  2. Vitreous hemorrhage OS  - VH onset Friday, 04.18.25 while vacationing in [place]  - s/p IVA OS #1 (04.22.25) -- no improvement  - now s/p PPV OS as above  - VH cleared post-vitrectomy  3. Lattice degeneration right eye - small patch of pigmented lattice at 0630 - asymptomatic - discussed findings, prognosis, and treatment options including observation -  recommend laser retinopexy OD--but will schedule once vision improves OS  4. Mixed Cataract OU - The symptoms of cataract, surgical options, and treatments and risks were discussed with patient. - discussed diagnosis and progression - monitor  Ophthalmic Meds Ordered this visit:  No orders of the defined types were placed in this encounter.    No follow-ups on file.  There are no Patient Instructions on file for this visit.  Explained the diagnoses, plan, and follow up with the patient and they expressed understanding.  Patient expressed understanding of the importance of proper follow up care.   This document serves as a record of services personally performed by Jeanice Millard, MD, PhD. It was created on their behalf by Morley Arabia. Bevin Bucks, OA an ophthalmic technician. The creation of this record is the provider's dictation and/or activities during the visit.    Electronically signed by: Morley Arabia. Bevin Bucks, OA 08/17/23 8:46 AM   Jeanice Millard, M.D., Ph.D. Diseases & Surgery of the Retina and Vitreous [facility]    Abbreviations: M myopia (nearsighted); A astigmatism; H hyperopia (farsighted); P presbyopia; Mrx spectacle prescription;  CTL contact lenses; OD right eye; OS left eye; OU both eyes  XT exotropia; ET esotropia; PEK punctate epithelial keratitis; PEE punctate epithelial erosions; DES dry eye syndrome; MGD meibomian gland dysfunction; ATs artificial tears; PFAT's preservative free artificial tears; NSC nuclear sclerotic cataract; PSC  posterior subcapsular cataract; ERM epi-retinal membrane; PVD posterior vitreous detachment; RD retinal detachment; DM diabetes mellitus; DR diabetic retinopathy; NPDR non-proliferative diabetic retinopathy; PDR proliferative diabetic retinopathy; CSME clinically significant macular edema; DME diabetic macular edema; dbh dot blot hemorrhages; CWS cotton wool spot; POAG primary open angle glaucoma; C/D cup-to-disc ratio; HVF humphrey visual field; GVF goldmann visual field; OCT optical coherence tomography; IOP intraocular pressure; BRVO Branch retinal vein occlusion; CRVO central retinal vein occlusion; CRAO central retinal artery occlusion; BRAO branch retinal artery occlusion; RT retinal tear; SB scleral buckle; PPV pars plana vitrectomy; VH Vitreous hemorrhage; PRP panretinal laser photocoagulation; IVK intravitreal kenalog ; VMT vitreomacular traction; MH Macular hole;  NVD neovascularization of the disc; NVE neovascularization elsewhere; AREDS age related eye disease study; ARMD age related macular degeneration; POAG primary open angle glaucoma; EBMD epithelial/anterior basement membrane dystrophy; ACIOL anterior chamber intraocular lens; IOL intraocular lens; PCIOL posterior chamber intraocular lens; Phaco/IOL phacoemulsification with intraocular lens placement; PRK photorefractive keratectomy; LASIK laser assisted in situ keratomileusis; HTN hypertension; DM diabetes mellitus; COPD chronic obstructive pulmonary disease

## 2023-08-17 ENCOUNTER — Ambulatory Visit (INDEPENDENT_AMBULATORY_CARE_PROVIDER_SITE_OTHER): Admitting: Ophthalmology

## 2023-08-17 ENCOUNTER — Encounter (INDEPENDENT_AMBULATORY_CARE_PROVIDER_SITE_OTHER): Payer: Self-pay | Admitting: Ophthalmology

## 2023-08-17 DIAGNOSIS — H25813 Combined forms of age-related cataract, bilateral: Secondary | ICD-10-CM

## 2023-08-17 DIAGNOSIS — H35411 Lattice degeneration of retina, right eye: Secondary | ICD-10-CM

## 2023-08-17 DIAGNOSIS — H3322 Serous retinal detachment, left eye: Secondary | ICD-10-CM | POA: Diagnosis not present

## 2023-08-17 DIAGNOSIS — H4312 Vitreous hemorrhage, left eye: Secondary | ICD-10-CM

## 2023-08-19 ENCOUNTER — Encounter (INDEPENDENT_AMBULATORY_CARE_PROVIDER_SITE_OTHER): Payer: Self-pay | Admitting: Ophthalmology

## 2023-08-19 NOTE — Progress Notes (Signed)
 This encounter was created in error - please disregard.

## 2023-09-11 NOTE — Progress Notes (Signed)
 [facility] - Clinic Note  09/14/2023   CHIEF COMPLAINT Patient presents for Retina Follow Up  HISTORY OF PRESENT ILLNESS: Briana Lamb is a 63 y.o. female who presents to the clinic today for:  HPI     Retina Follow Up   Patient presents with  Other.  In left eye.  This started 4 weeks ago.  I, the attending physician,  performed the HPI with the patient and updated documentation appropriately.        Comments   Patient here for 4 weeks retina follow up for RD OS. Patient states vision getting better. Sees tiny circle of bubble at the bottom fo va. Has a glaze over vision. Sees but blurry. No eye pain. Using drops.      Last edited by [name], MD on 09/16/2023  3:38 PM.    Pt states she can still see her gas bubble, but its very small, she could see better with the pin holes this morning, she feels like her left eye has a donut glaze over it, she has a lot of glare with lights  Referring physician: [name], [name], OD [address] [place] Briana Lamb,  Briana Lamb  HISTORICAL INFORMATION:  Selected notes from the MEDICAL RECORD NUMBER Referred by Dr. [name] for concern of vitreous hemorrhage OS LEE:  Ocular Hx- PMH-   CURRENT MEDICATIONS: Current Outpatient Medications (Ophthalmic Drugs)  Medication Sig   bacitracin -polymyxin b  (POLYSPORIN ) ophthalmic ointment Place into the right eye 4 (four) times daily. Place a 1/2 inch ribbon of ointment into the lower eyelid.   prednisoLONE  acetate (PRED FORTE ) 1 % ophthalmic suspension Place 1 drop into the left eye 4 (four) times daily.   No current facility-administered medications for this visit. (Ophthalmic Drugs)   Current Outpatient Medications (Other)  Medication Sig   Ascorbic Acid (VITAMIN C) 1000 MG tablet Take 1,000 mg by mouth daily.   b complex vitamins tablet Take 1 tablet by mouth daily.   L-Arginine 500 MG CAPS Take 500 mg by mouth daily.   L-CITRULLINE PO Take 1 capsule by  mouth daily.   Multiple Vitamins-Minerals (MULTIVITAMIN WITH MINERALS) tablet Take 1 tablet by mouth daily.   TURMERIC CURCUMIN PO Take 1 capsule by mouth daily.   HYDROcodone -acetaminophen  (NORCO/VICODIN) 5-325 MG tablet Take 1 tablet by mouth every 4 (four) hours as needed. (Patient not taking: Reported on 09/14/2023)   No current facility-administered medications for this visit. (Other)   REVIEW OF SYSTEMS: ROS   Positive for: Musculoskeletal, Eyes, Allergic/Imm Negative for: Constitutional, Gastrointestinal, Neurological, Skin, Genitourinary, HENT, Endocrine, Cardiovascular, Respiratory, Psychiatric, Heme/Lymph Last edited by Orval Asberry RAMAN, COA on 09/14/2023  9:19 AM.        ALLERGIES Allergies  Allergen Reactions   Penicillins Swelling and Other (See Comments)    SWELLING REACTION UNSPECIFIED  Has patient had a PCN reaction causing immediate rash, facial/tongue/throat swelling, SOB or lightheadedness with hypotension: unkn Has patient had a PCN reaction causing severe rash involving mucus membranes or skin necrosis: unkn Has patient had a PCN reaction that required hospitalization: unkn Has patient had a PCN reaction occurring within the last 10 years: unkn If all of the above answers are NO, then may proceed with Cephalosporin use.    Bee Venom Swelling    SWELLING LOCALIZED   PAST MEDICAL HISTORY Past Medical History:  Diagnosis Date   Anxiety    situational per pt (about surgery)   Arthritis    knees (01/08/2018)  Bee sting allergy    Seasonal allergies    Past Surgical History:  Procedure Laterality Date   ANKLE FRACTURE SURGERY Right 1995   ANKLE HARDWARE REMOVAL Right ~ 1996   CESAREAN SECTION  1999   FRACTURE SURGERY     JOINT REPLACEMENT     TOTAL KNEE ARTHROPLASTY Left 01/08/2018   TOTAL KNEE ARTHROPLASTY Left 01/08/2018   Procedure: TOTAL KNEE ARTHROPLASTY;  Surgeon: [name], MD;  Location: [facility] OR;  Service: Orthopedics;  Laterality:  Left;   VITRECTOMY 25 GAUGE WITH SCLERAL BUCKLE Left 07/19/2023   Procedure: VITRECTOMY, USING 25-GAUGE INSTRUMENTS, WITH SCLERAL BUCKLING;  Surgeon: [name], MD;  Location: [facility] OR;  Service: Ophthalmology;  Laterality: Left;   WRIST FRACTURE SURGERY Right 04/2006   FAMILY HISTORY Family History  Problem Relation Age of Onset   Stroke Maternal Grandfather    SOCIAL HISTORY Social History   Tobacco Use   Smoking status: Former    Current packs/day: 0.00    Average packs/day: 0.5 packs/day for 12.0 years (6.0 ttl pk-yrs)    Types: Cigarettes    Start date: 56    Quit date: 1989    Years since quitting: 36.5   Smokeless tobacco: Never  Vaping Use   Vaping status: Never Used  Substance Use Topics   Alcohol use: Yes    Comment: socially- maybe once a month   Drug use: Never       OPHTHALMIC EXAM:  Base Eye Exam     Visual Acuity (Snellen - Linear)       Right Left   Dist cc 20/20 20/400   Dist ph cc  20/100 -2    Correction: Glasses         Tonometry (Tonopen, 9:15 AM)       Right Left   Pressure 18 14         Pupils       Dark Light Shape React APD   Right 3 2 Round Brisk None   Left 3 2 Round Minimal None         Visual Fields (Counting fingers)       Left Right     Full   Restrictions Partial outer inferior temporal, inferior nasal deficiencies          Extraocular Movement       Right Left    Full, Ortho Full, Ortho         Neuro/Psych     Oriented x3: Yes   Mood/Affect: Normal         Dilation     Both eyes: 1.0% Mydriacyl , 2.5% Phenylephrine  @ 9:14 AM           Slit Lamp and Fundus Exam     External Exam       Right Left   External  Mild lid edema, periorbital edema         Slit Lamp Exam       Right Left   Lids/Lashes Dermatochalasis - upper lid Dermatochalasis - upper lid   Conjunctiva/Sclera Small nasal pingeucula temporal pinguecula, Subconjunctival hemorrhage -- improved, Suture dissolving  temporally   Cornea trace PEE mild arcus, 3+diffuse PEE, central defect -- closed   Anterior Chamber deep, clear, narrow temporal angle Deep and clear, narrow angles   Iris Round and dilated, No NVI Round and dilated, No NVI   Lens 2+ Nuclear sclerosis, 2+ Cortical cataract 3+ Nuclear sclerosis with brunescence, 2-3+ Cortical cataract, 3+PSC, pigment on anterior capsule  Anterior Vitreous syneresis, vitreous condensations Post vitrectomy, VH cleared, 3% gas bubble         Fundus Exam       Right Left   Disc  Pink and Sharp   C/D Ratio 0.3 0.2   Macula  Flat, Blunted foveal reflex, RPE mottling, No heme or edema   Vessels  no view, attenuated, mild tortuosity   Periphery  retina attached over buckle, good buckle height, good laser over buckle and surrounding retinal breaks at 1030           IMAGING AND PROCEDURES  Imaging and Procedures for 09/14/2023  OCT, Retina - OU - Both Eyes       Right Eye Quality was good. Central Foveal Thickness: 293. Progression has been stable. Findings include normal foveal contour, no IRF, no SRF.   Left Eye Quality was poor. Central Foveal Thickness: 359. Progression has worsened. Findings include no IRF, no SRF, abnormal foveal contour (Retina reattached; Patchy ellipsoid signal, ERM with retinal thickening IN macula).   Notes *Images captured and stored on drive  Diagnosis / Impression:  OD: NFP, no IRF/SRF OS: Retina reattached; Patchy ellipsoid signal, ERM with retinal thickening IN macula  Clinical management:  See below  Abbreviations: NFP - Normal foveal profile. CME - cystoid macular edema. PED - pigment epithelial detachment. IRF - intraretinal fluid. SRF - subretinal fluid. EZ - ellipsoid zone. ERM - epiretinal membrane. ORA - outer retinal atrophy. ORT - outer retinal tubulation. SRHM - subretinal hyper-reflective material. IRHM - intraretinal hyper-reflective material           ASSESSMENT/PLAN:   ICD-10-CM   1. Left  retinal detachment  H33.22 OCT, Retina - OU - Both Eyes    2. 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 Combined forms of age-related cataract of both eyes  H25.813      Rhegmatogenous retinal detachment, OS - bullous total retinal detachment w/ small retinal breaks at 1030 hidden behind vit hemorrhage - now POW8 s/p PPV/POC/EL/FAX/14% C3F8 OS, 05.22.2025 - doing well - retina attached and in good position -- good buckle height and laser around breaks - Gas bubble ~2% - IOP 14 - begin PF taper -- 4,3,2,1 drops daily, decrease weekly PSO ung -- okay to stop; start using Genteal gel at night - start AT's QID - d/c face down positioning  - avoid laying flat on back  - post op drop and positioning instructions reviewed  - f/u 4 weeks -- POV/ DFE  2. Vitreous hemorrhage OS  - VH onset Friday, 04.18.25 while vacationing in [place]  - s/p IVA OS #1 (04.22.25) -- no improvement  - now s/p PPV OS as above  - VH cleared post-vitrectomy  3. Lattice degeneration right eye - small patch of pigmented lattice at 0630 - asymptomatic - discussed findings, prognosis, and treatment options including observation - recommend laser retinopexy OD--but will schedule once vision improves OS  4. Mixed Cataract OU - The symptoms of cataract, surgical options, and treatments and risks were discussed with patient. - discussed diagnosis and progression -- specifically post-vit progression - will refer back to [facility] for cataract eval - clear from a retina standpoint to proceed with cataract surgery when pt and surgeon are ready    Ophthalmic Meds Ordered this visit:  No orders of the defined types were placed in this encounter.  Return for f/u 4-6 weeks, RD OS, DFE, OCT.  There are no Patient Instructions on file for this visit.  This document serves as a record of services personally performed by Redell JUDITHANN Hans, MD,  PhD. It was created on their behalf by Delon Newness COT, an ophthalmic technician. The creation of this record is the provider's dictation and/or activities during the visit.    Electronically signed by: Delon Newness COT TODAY 07.15.2025 3:39 PM  This document serves as a record of services personally performed by Redell JUDITHANN Hans, MD, PhD. It was created on their behalf by Alan PARAS. Delores, OA an ophthalmic technician. The creation of this record is the provider's dictation and/or activities during the visit.    Electronically signed by: Alan PARAS. Delores BIHARI 09/16/23 3:39 PM  Redell JUDITHANN Hans, M.D., Ph.D. Diseases & Surgery of the Retina and Vitreous [facility] 09/14/2023   I have reviewed the above documentation for accuracy and completeness, and I agree with the above. Redell JUDITHANN Hans, M.D., Ph.D. 09/16/23 3:42 PM   Abbreviations: M myopia (nearsighted); A astigmatism; H hyperopia (farsighted); P presbyopia; Mrx spectacle prescription;  CTL contact lenses; OD right eye; OS left eye; OU both eyes  XT exotropia; ET esotropia; PEK punctate epithelial keratitis; PEE punctate epithelial erosions; DES dry eye syndrome; MGD meibomian gland dysfunction; ATs artificial tears; PFAT's preservative free artificial tears; NSC nuclear sclerotic cataract; PSC posterior subcapsular cataract; ERM epi-retinal membrane; PVD posterior vitreous detachment; RD retinal detachment; DM diabetes mellitus; DR diabetic retinopathy; NPDR non-proliferative diabetic retinopathy; PDR proliferative diabetic retinopathy; CSME clinically significant macular edema; DME diabetic macular edema; dbh dot blot hemorrhages; CWS cotton wool spot; POAG primary open angle glaucoma; C/D cup-to-disc ratio; HVF humphrey visual field; GVF goldmann visual field; OCT optical coherence tomography; IOP intraocular pressure; BRVO Branch retinal vein occlusion; CRVO central retinal vein occlusion; CRAO central retinal  artery occlusion; BRAO branch retinal artery occlusion; RT retinal tear; SB scleral buckle; PPV pars plana vitrectomy; VH Vitreous hemorrhage; PRP panretinal laser photocoagulation; IVK intravitreal kenalog ; VMT vitreomacular traction; MH Macular hole;  NVD neovascularization of the disc; NVE neovascularization elsewhere; AREDS age related eye disease study; ARMD age related macular degeneration; POAG primary open angle glaucoma; EBMD epithelial/anterior basement membrane dystrophy; ACIOL anterior chamber intraocular lens; IOL intraocular lens; PCIOL posterior chamber intraocular lens; Phaco/IOL phacoemulsification with intraocular lens placement; PRK photorefractive keratectomy; LASIK laser assisted in situ keratomileusis; HTN hypertension; DM diabetes mellitus; COPD chronic obstructive pulmonary disease

## 2023-09-14 ENCOUNTER — Ambulatory Visit (INDEPENDENT_AMBULATORY_CARE_PROVIDER_SITE_OTHER): Admitting: Ophthalmology

## 2023-09-14 ENCOUNTER — Encounter (INDEPENDENT_AMBULATORY_CARE_PROVIDER_SITE_OTHER): Payer: Self-pay | Admitting: Ophthalmology

## 2023-09-14 DIAGNOSIS — H25813 Combined forms of age-related cataract, bilateral: Secondary | ICD-10-CM

## 2023-09-14 DIAGNOSIS — H3322 Serous retinal detachment, left eye: Secondary | ICD-10-CM | POA: Diagnosis not present

## 2023-09-14 DIAGNOSIS — H35411 Lattice degeneration of retina, right eye: Secondary | ICD-10-CM

## 2023-09-14 DIAGNOSIS — H4312 Vitreous hemorrhage, left eye: Secondary | ICD-10-CM

## 2023-09-16 ENCOUNTER — Encounter (INDEPENDENT_AMBULATORY_CARE_PROVIDER_SITE_OTHER): Payer: Self-pay | Admitting: Ophthalmology

## 2023-09-28 NOTE — Progress Notes (Signed)
 [facility] - Clinic Note  10/12/2023   CHIEF COMPLAINT Patient presents for Retina Follow Up  HISTORY OF PRESENT ILLNESS: Briana Lamb is a 63 y.o. female who presents to the clinic today for:  HPI     Retina Follow Up   Patient presents with  Retinal Break/Detachment.  In left eye.  This started 3 months ago.  Duration of 4 weeks.  I, the attending physician,  performed the HPI with the patient and updated documentation appropriately.        Comments   Pt presents for s/p 3 months PPV OS. Pt states no changes in vision. Pt is consistent with PF 1 gtt OS every day and is also using PSO ung about once per day.       Last edited by [name], MD on 10/14/2023  9:28 PM.     Pt states VA does seem better OS just still hazy. Scheduled for cataract sx in September.    Referring physician: [name], [name], OD [address] [place],  [place] [address]  HISTORICAL INFORMATION:  Selected notes from the MEDICAL RECORD NUMBER Referred by Dr. [name] for concern of vitreous hemorrhage OS LEE:  Ocular Hx- PMH-   CURRENT MEDICATIONS: Current Outpatient Medications (Ophthalmic Drugs)  Medication Sig   bacitracin -polymyxin b  (POLYSPORIN ) ophthalmic ointment Place into the right eye 4 (four) times daily. Place a 1/2 inch ribbon of ointment into the lower eyelid.   prednisoLONE  acetate (PRED FORTE ) 1 % ophthalmic suspension Place 1 drop into the left eye 4 (four) times daily.   No current facility-administered medications for this visit. (Ophthalmic Drugs)   Current Outpatient Medications (Other)  Medication Sig   Ascorbic Acid (VITAMIN C) 1000 MG tablet Take 1,000 mg by mouth daily.   b complex vitamins tablet Take 1 tablet by mouth daily.   HYDROcodone -acetaminophen  (NORCO/VICODIN) 5-325 MG tablet Take 1 tablet by mouth every 4 (four) hours as needed. (Patient not taking: Reported on 09/14/2023)   L-Arginine 500 MG CAPS Take 500 mg by mouth  daily.   L-CITRULLINE PO Take 1 capsule by mouth daily.   Multiple Vitamins-Minerals (MULTIVITAMIN WITH MINERALS) tablet Take 1 tablet by mouth daily.   TURMERIC CURCUMIN PO Take 1 capsule by mouth daily.   No current facility-administered medications for this visit. (Other)   REVIEW OF SYSTEMS: ROS   Positive for: Musculoskeletal, Eyes, Allergic/Imm Negative for: Constitutional, Gastrointestinal, Neurological, Skin, Genitourinary, HENT, Endocrine, Cardiovascular, Respiratory, Psychiatric, Heme/Lymph Last edited by Elnor Avelina RAMAN, COT on 10/12/2023  9:21 AM.     ALLERGIES Allergies  Allergen Reactions   Penicillins Swelling and Other (See Comments)    SWELLING REACTION UNSPECIFIED  Has patient had a PCN reaction causing immediate rash, facial/tongue/throat swelling, SOB or lightheadedness with hypotension: unkn Has patient had a PCN reaction causing severe rash involving mucus membranes or skin necrosis: unkn Has patient had a PCN reaction that required hospitalization: unkn Has patient had a PCN reaction occurring within the last 10 years: unkn If all of the above answers are NO, then may proceed with Cephalosporin use.    Bee Venom Swelling    SWELLING LOCALIZED   PAST MEDICAL HISTORY Past Medical History:  Diagnosis Date   Anxiety    situational per pt (about surgery)   Arthritis    knees (01/08/2018)   Bee sting allergy    Seasonal allergies    Past Surgical History:  Procedure Laterality Date   ANKLE FRACTURE SURGERY  Right 1995   ANKLE HARDWARE REMOVAL Right ~ 1996   CESAREAN SECTION  1999   FRACTURE SURGERY     JOINT REPLACEMENT     TOTAL KNEE ARTHROPLASTY Left 01/08/2018   TOTAL KNEE ARTHROPLASTY Left 01/08/2018   Procedure: TOTAL KNEE ARTHROPLASTY;  Surgeon: [name], MD;  Location: [facility] OR;  Service: Orthopedics;  Laterality: Left;   VITRECTOMY 25 GAUGE WITH SCLERAL BUCKLE Left 07/19/2023   Procedure: VITRECTOMY, USING 25-GAUGE INSTRUMENTS, WITH  SCLERAL BUCKLING;  Surgeon: [name], MD;  Location: [facility];  Service: Ophthalmology;  Laterality: Left;   WRIST FRACTURE SURGERY Right 04/2006   FAMILY HISTORY Family History  Problem Relation Age of Onset   Stroke Maternal Grandfather    SOCIAL HISTORY Social History   Tobacco Use   Smoking status: Former    Current packs/day: 0.00    Average packs/day: 0.5 packs/day for 12.0 years (6.0 ttl pk-yrs)    Types: Cigarettes    Start date: 26    Quit date: 1989    Years since quitting: 36.6   Smokeless tobacco: Never  Vaping Use   Vaping status: Never Used  Substance Use Topics   Alcohol use: Yes    Comment: socially- maybe once a month   Drug use: Never       OPHTHALMIC EXAM:  Base Eye Exam     Visual Acuity (Snellen - Linear)       Right Left   Dist cc 20/20 20/800 +1   Dist ph cc  20/100    Correction: Glasses         Tonometry (Tonopen, 9:13 AM)       Right Left   Pressure 20 14         Pupils       Pupils Dark Light Shape React APD   Right PERRL 3 2 Round Brisk None   Left PERRL 5 5 Round None None         Visual Fields       Left Right    Full Full         Extraocular Movement       Right Left    Full, Ortho Full, Ortho         Neuro/Psych     Oriented x3: Yes   Mood/Affect: Normal         Dilation     Both eyes: 1.0% Mydriacyl , 2.5% Phenylephrine  @ 9:16 AM           Slit Lamp and Fundus Exam     External Exam       Right Left   External  Mild lid edema, periorbital edema         Slit Lamp Exam       Right Left   Lids/Lashes Dermatochalasis - upper lid Dermatochalasis - upper lid   Conjunctiva/Sclera Small nasal pingeucula temporal pinguecula, Subconjunctival hemorrhage -- improved, Suture dissolving temporally   Cornea trace PEE mild arcus, 1-2+ PEE   Anterior Chamber deep, clear, narrow temporal angle Deep and clear, no cell or flare, narrow angles   Iris Round and dilated, No NVI Irreg dilation,  PS @ 730, No NVI   Lens 2+ Nuclear sclerosis, 2+ Cortical cataract 3+ Nuclear sclerosis with brunescence, 2-3+ Cortical cataract, 3+PSC, pigment on anterior capsule   Anterior Vitreous syneresis, vitreous condensations Post vitrectomy, VH cleared, 3% gas bubble         Fundus Exam       Right  Left   Disc Pink and Sharp, temporal PPA Pink and Sharp   C/D Ratio 0.3 0.2   Macula Flat, Good foveal reflex, RPE mottling, No heme or edema Flat, Blunted foveal reflex, RPE mottling, No heme or edema   Vessels Attenuated, Tortuous attenuated, mild tortuosity   Periphery Attached, pigmented lattice @ 630-no new RT/RD or lattice retina attached over buckle, good buckle height, good laser over buckle and surrounding retinal breaks at 1030, mild ERM           Refraction     Wearing Rx       Sphere Cylinder Axis Add   Right -3.00 +1.75 035 +2.50   Left -0.75 +0.75 088 +2.50    Type: Progressive           IMAGING AND PROCEDURES  Imaging and Procedures for 10/12/2023  OCT, Retina - OU - Both Eyes       Right Eye Quality was good. Central Foveal Thickness: 292. Progression has been stable. Findings include normal foveal contour, no IRF, no SRF.   Left Eye Quality was poor. Central Foveal Thickness: 403. Progression has worsened. Findings include no IRF, no SRF, abnormal foveal contour (Retina reattached; Patchy ellipsoid signal, ERM with retinal thickening IN macula--slightly increased).   Notes *Images captured and stored on drive  Diagnosis / Impression:  OD: NFP, no IRF/SRF OS: Retina reattached; Patchy ellipsoid signal, ERM with retinal thickening IN macula--slightly increased  Clinical management:  See below  Abbreviations: NFP - Normal foveal profile. CME - cystoid macular edema. PED - pigment epithelial detachment. IRF - intraretinal fluid. SRF - subretinal fluid. EZ - ellipsoid zone. ERM - epiretinal membrane. ORA - outer retinal atrophy. ORT - outer retinal tubulation.  SRHM - subretinal hyper-reflective material. IRHM - intraretinal hyper-reflective material            ASSESSMENT/PLAN:   ICD-10-CM   1. Left retinal detachment  H33.22 OCT, Retina - OU - Both Eyes    2. 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 Combined forms of age-related cataract of both eyes  H25.813       Rhegmatogenous retinal detachment, OS - bullous total retinal detachment w/ small retinal breaks at 1030 hidden behind vit hemorrhage - now POW8 s/p PPV/POC/EL/FAX/14% C3F8 OS, 05.22.2025 - doing well - retina attached and in good position -- good buckle height and laser around breaks - Gas bubble ~2-3% - IOP 14 - cont prednisolone -pt on 1 gtt daily OS-continue for now.  PSO ung -- okay to stop;  - start using Genteal gel at night--pt has a little ointment left, will switch to OTC once done. - start AT's QID - d/c face down positioning - post op drop and positioning instructions reviewed  - f/u 8.27 @ 945 for retinopexy OD, see below weeks -- DFE, OCT  2. Vitreous hemorrhage OS  - VH onset Friday, 04.18.25 while vacationing in [place]  - s/p IVA OS #1 (04.22.25) -- no improvement  - now s/p PPV OS as above  - VH cleared post-vitrectomy  3. Lattice degeneration right eye - small patch of pigmented lattice at 0630 - asymptomatic - discussed findings, prognosis, and treatment options including observation - recommend laser retinopexy OD, schedule for 08.27 @ 945am.  - Pt will need letter to extend leave.  4. Mixed Cataract OU - The symptoms of cataract, surgical options, and treatments and  risks were discussed with patient. - discussed diagnosis and progression -- specifically post-vit progression - clear from a retina standpoint to proceed with cataract surgery when pt and surgeon are ready  - referred back to [facility] -- f/u visit scheduled for September   Ophthalmic Meds Ordered  this visit:  No orders of the defined types were placed in this encounter.    Return for 08.27 at 945am for laser retinopexy OD lattice degen, DFE, OCT.  There are no Patient Instructions on file for this visit.  This document serves as a record of services personally performed by Redell JUDITHANN Hans, MD, PhD. It was created on their behalf by Avelina Pereyra, COA an ophthalmic technician. The creation of this record is the provider's dictation and/or activities during the visit.   Electronically signed by: Avelina GORMAN Pereyra, COT  10/14/23  9:28 PM   Redell JUDITHANN Hans, M.D., Ph.D. Diseases & Surgery of the Retina and Vitreous [facility] 10/12/2023  I have reviewed the above documentation for accuracy and completeness, and I agree with the above. Redell JUDITHANN Hans, M.D., Ph.D. 10/14/23 9:31 PM   Abbreviations: M myopia (nearsighted); A astigmatism; H hyperopia (farsighted); P presbyopia; Mrx spectacle prescription;  CTL contact lenses; OD right eye; OS left eye; OU both eyes  XT exotropia; ET esotropia; PEK punctate epithelial keratitis; PEE punctate epithelial erosions; DES dry eye syndrome; MGD meibomian gland dysfunction; ATs artificial tears; PFAT's preservative free artificial tears; NSC nuclear sclerotic cataract; PSC posterior subcapsular cataract; ERM epi-retinal membrane; PVD posterior vitreous detachment; RD retinal detachment; DM diabetes mellitus; DR diabetic retinopathy; NPDR non-proliferative diabetic retinopathy; PDR proliferative diabetic retinopathy; CSME clinically significant macular edema; DME diabetic macular edema; dbh dot blot hemorrhages; CWS cotton wool spot; POAG primary open angle glaucoma; C/D cup-to-disc ratio; HVF humphrey visual field; GVF goldmann visual field; OCT optical coherence tomography; IOP intraocular pressure; BRVO Branch retinal vein occlusion; CRVO central retinal vein occlusion; CRAO central retinal artery occlusion; BRAO branch retinal  artery occlusion; RT retinal tear; SB scleral buckle; PPV pars plana vitrectomy; VH Vitreous hemorrhage; PRP panretinal laser photocoagulation; IVK intravitreal kenalog ; VMT vitreomacular traction; MH Macular hole;  NVD neovascularization of the disc; NVE neovascularization elsewhere; AREDS age related eye disease study; ARMD age related macular degeneration; POAG primary open angle glaucoma; EBMD epithelial/anterior basement membrane dystrophy; ACIOL anterior chamber intraocular lens; IOL intraocular lens; PCIOL posterior chamber intraocular lens; Phaco/IOL phacoemulsification with intraocular lens placement; PRK photorefractive keratectomy; LASIK laser assisted in situ keratomileusis; HTN hypertension; DM diabetes mellitus; COPD chronic obstructive pulmonary disease

## 2023-10-12 ENCOUNTER — Encounter (INDEPENDENT_AMBULATORY_CARE_PROVIDER_SITE_OTHER): Payer: Self-pay | Admitting: Ophthalmology

## 2023-10-12 ENCOUNTER — Ambulatory Visit (INDEPENDENT_AMBULATORY_CARE_PROVIDER_SITE_OTHER): Admitting: Ophthalmology

## 2023-10-12 DIAGNOSIS — H25813 Combined forms of age-related cataract, bilateral: Secondary | ICD-10-CM

## 2023-10-12 DIAGNOSIS — H3322 Serous retinal detachment, left eye: Secondary | ICD-10-CM | POA: Diagnosis not present

## 2023-10-12 DIAGNOSIS — H35411 Lattice degeneration of retina, right eye: Secondary | ICD-10-CM

## 2023-10-12 DIAGNOSIS — H4312 Vitreous hemorrhage, left eye: Secondary | ICD-10-CM

## 2023-10-14 ENCOUNTER — Encounter (INDEPENDENT_AMBULATORY_CARE_PROVIDER_SITE_OTHER): Payer: Self-pay | Admitting: Ophthalmology

## 2023-10-18 NOTE — Progress Notes (Signed)
 [facility] - Clinic Note  10/24/2023   CHIEF COMPLAINT Patient presents for Retina Follow Up  HISTORY OF PRESENT ILLNESS: Briana Lamb is a 63 y.o. female who presents to the clinic today for:  HPI     Retina Follow Up   Patient presents with  Other.  In both eyes.  This started 12 days ago.  I, the attending physician,  performed the HPI with the patient and updated documentation appropriately.        Comments   Patient here for 12 days retina follow up for lattice deg OD/retinopexy OD. Patient states vision not too bad. Has cataract evaluation scheduled sept 26 for OS. OS has glazed over because of cataract. Depth perception off. No eye pain. Using pink top every day OS, ung every day OS. Uses AT prn      Last edited by [name], MD on 10/24/2023 12:01 PM.    Pt states VA does seem better OS just still hazy. Scheduled for cataract f/u in September.    Referring physician: [name], Lamb, OD [address] [place],  [place] [address]  HISTORICAL INFORMATION:  Selected notes from the MEDICAL RECORD NUMBER Referred by Dr. [name] for concern of vitreous hemorrhage OS LEE:  Ocular Hx- PMH-   CURRENT MEDICATIONS: Current Outpatient Medications (Ophthalmic Drugs)  Medication Sig   bacitracin -polymyxin b  (POLYSPORIN ) ophthalmic ointment Place into the right eye 4 (four) times daily. Place a 1/2 inch ribbon of ointment into the lower eyelid.   prednisoLONE  acetate (PRED FORTE ) 1 % ophthalmic suspension Place 1 drop into the left eye 4 (four) times daily.   No current facility-administered medications for this visit. (Ophthalmic Drugs)   Current Outpatient Medications (Other)  Medication Sig   Ascorbic Acid (VITAMIN C) 1000 MG tablet Take 1,000 mg by mouth daily.   b complex vitamins tablet Take 1 tablet by mouth daily.   L-Arginine 500 MG CAPS Take 500 mg by mouth daily.   L-CITRULLINE PO Take 1 capsule by mouth daily.   Multiple  Vitamins-Minerals (MULTIVITAMIN WITH MINERALS) tablet Take 1 tablet by mouth daily.   TURMERIC CURCUMIN PO Take 1 capsule by mouth daily.   HYDROcodone -acetaminophen  (NORCO/VICODIN) 5-325 MG tablet Take 1 tablet by mouth every 4 (four) hours as needed. (Patient not taking: Reported on 10/24/2023)   No current facility-administered medications for this visit. (Other)   REVIEW OF SYSTEMS: ROS   Positive for: Musculoskeletal, Eyes, Allergic/Imm Negative for: Constitutional, Gastrointestinal, Neurological, Skin, Genitourinary, HENT, Endocrine, Cardiovascular, Respiratory, Psychiatric, Heme/Lymph Last edited by Briana Lamb, COA on 10/24/2023 10:01 AM.      ALLERGIES Allergies  Allergen Reactions   Penicillins Swelling and Other (See Comments)    SWELLING REACTION UNSPECIFIED  Has patient had a PCN reaction causing immediate rash, facial/tongue/throat swelling, SOB or lightheadedness with hypotension: unkn Has patient had a PCN reaction causing severe rash involving mucus membranes or skin necrosis: unkn Has patient had a PCN reaction that required hospitalization: unkn Has patient had a PCN reaction occurring within the last 10 years: unkn If all of the above answers are NO, then may proceed with Cephalosporin use.    Bee Venom Swelling    SWELLING LOCALIZED   PAST MEDICAL HISTORY Past Medical History:  Diagnosis Date   Anxiety    situational per pt (about surgery)   Arthritis    knees (01/08/2018)   Bee sting allergy    Seasonal allergies    Past Surgical  History:  Procedure Laterality Date   ANKLE FRACTURE SURGERY Right 1995   ANKLE HARDWARE REMOVAL Right ~ 1996   CESAREAN SECTION  1999   FRACTURE SURGERY     JOINT REPLACEMENT     TOTAL KNEE ARTHROPLASTY Left 01/08/2018   TOTAL KNEE ARTHROPLASTY Left 01/08/2018   Procedure: TOTAL KNEE ARTHROPLASTY;  Surgeon: [name], MD;  Location: [facility] OR;  Service: Orthopedics;  Laterality: Left;   VITRECTOMY 25 GAUGE WITH  SCLERAL BUCKLE Left 07/19/2023   Procedure: VITRECTOMY, USING 25-GAUGE INSTRUMENTS, WITH SCLERAL BUCKLING;  Surgeon: [name], MD;  Location: [facility] OR;  Service: Ophthalmology;  Laterality: Left;   WRIST FRACTURE SURGERY Right 04/2006   FAMILY HISTORY Family History  Problem Relation Age of Onset   Stroke Maternal Grandfather    SOCIAL HISTORY Social History   Tobacco Use   Smoking status: Former    Current packs/day: 0.00    Average packs/day: 0.5 packs/day for 12.0 years (6.0 ttl pk-yrs)    Types: Cigarettes    Start date: 72    Quit date: 1989    Years since quitting: 36.6   Smokeless tobacco: Never  Vaping Use   Vaping status: Never Used  Substance Use Topics   Alcohol use: Yes    Comment: socially- maybe once a month   Drug use: Never       OPHTHALMIC EXAM:  Base Eye Exam     Visual Acuity (Snellen - Linear)       Right Left   Dist cc 20/20 20/300   Dist ph cc  20/100    Correction: Glasses         Tonometry (Tonopen, 9:56 AM)       Right Left   Pressure 19 12         Pupils       Dark Light Shape React APD   Right 3 2 Round Brisk None   Left 3 2 Round Minimal None         Visual Fields (Counting fingers)       Left Right    Full Full         Extraocular Movement       Right Left    Full, Ortho Full, Ortho         Neuro/Psych     Oriented x3: Yes   Mood/Affect: Normal         Dilation     Both eyes: 1.0% Mydriacyl , 2.5% Phenylephrine  @ 9:56 AM           Slit Lamp and Fundus Exam     External Exam       Right Left   External  Mild lid edema, periorbital edema         Slit Lamp Exam       Right Left   Lids/Lashes Dermatochalasis - upper lid Dermatochalasis - upper lid   Conjunctiva/Sclera Small nasal pingeucula temporal pinguecula, Subconjunctival hemorrhage -- improved, Suture dissolving temporally   Cornea trace PEE mild arcus, 1-2+ PEE   Anterior Chamber deep, clear, narrow temporal angle Deep and  clear, no cell or flare, narrow angles   Iris Round and dilated, No NVI Irreg dilation, PS @ 730, No NVI   Lens 2+ Nuclear sclerosis, 2+ Cortical cataract 3+ Nuclear sclerosis with brunescence, 2-3+ Cortical cataract, 3+PSC, pigment on anterior capsule   Anterior Vitreous syneresis, vitreous condensations Post vitrectomy, VH cleared, 3% gas bubble         Fundus  Exam       Right Left   Disc Pink and Sharp, temporal PPA Pink and Sharp   C/D Ratio 0.3 0.2   Macula Flat, Good foveal reflex, RPE mottling, No heme or edema Flat, Blunted foveal reflex, RPE mottling, No heme or edema   Vessels Attenuated, Tortuous attenuated, mild tortuosity   Periphery Attached, pigmented lattice @ 630-no new RT/RD or lattice retina attached over buckle, good buckle height, good laser over buckle and surrounding retinal breaks at 1030, mild ERM           Refraction     Wearing Rx       Sphere Cylinder Axis Add   Right -3.00 +1.75 035 +2.50   Left -0.75 +0.75 088 +2.50    Type: Progressive           IMAGING AND PROCEDURES  Imaging and Procedures for 10/24/2023  OCT, Retina - OU - Both Eyes       Right Eye Quality was good. Central Foveal Thickness: 297. Progression has been stable. Findings include normal foveal contour, no IRF, no SRF.   Left Eye Quality was borderline. Central Foveal Thickness: 413. Progression has been stable. Findings include no IRF, no SRF, abnormal foveal contour (Retina reattached; Patchy ellipsoid signal, ERM with retinal thickening IN macula).   Notes *Images captured and stored on drive  Diagnosis / Impression:  OD: NFP, no IRF/SRF OS: Retina reattached; Patchy ellipsoid signal, ERM with retinal thickening IN macula--slightly increased  Clinical management:  See below  Abbreviations: NFP - Normal foveal profile. CME - cystoid macular edema. PED - pigment epithelial detachment. IRF - intraretinal fluid. SRF - subretinal fluid. EZ - ellipsoid zone. ERM -  epiretinal membrane. ORA - outer retinal atrophy. ORT - outer retinal tubulation. SRHM - subretinal hyper-reflective material. IRHM - intraretinal hyper-reflective material      Repair Retinal Breaks, Laser - OD - Right Eye       LASER PROCEDURE NOTE  Procedure:  Barrier laser retinopexy using slit lamp laser, RIGHT eye   Diagnosis:   Lattice degeneration w/ atrophic holes, RIGHT eye                     Patch of pigmented lattice at 0630  Surgeon: Redell Hans, MD, PhD  Anesthesia: Topical  Informed consent obtained, operative eye marked, and time out performed prior to initiation of laser.   Laser settings:  Lumenis Smart532 laser, slit lamp Lens: Mainster PRP 165 Power: 360 mW Spot size: 500 microns Duration: 30 msec  # spots: 314  Placement of laser: Using a Mainster PRP 165 contact lens at the slit lamp, laser was placed in three confluent rows around patch of pigmented lattice w/ atrophic holes at 0630 oclock anterior to equator.  Complications: None.  Patient tolerated the procedure well and received written and verbal post-procedure care information/education.          ASSESSMENT/PLAN:   ICD-10-CM   1. Left retinal detachment  H33.22 OCT, Retina - OU - Both Eyes    2. 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 Retinal hole of right eye  H33.321 Repair Retinal Breaks, Laser - OD - Right Eye    5. 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  PSO ung --  okay to stop;  - cont Genteal gel at night--pt has a little ointment left, will switch to OTC once done. - cont AT's QID  2. Vitreous hemorrhage OS  - VH onset Friday, 04.18.25 while vacationing in [place]  - s/p IVA OS #1 (04.22.25) -- no improvement  - now s/p PPV OS as above  - VH cleared post-vitrectomy  3,4. Lattice degeneration right eye - small patch of pigmented lattice at 0630 - asymptomatic - discussed findings, prognosis, and treatment options including observation - recommend laser retinopexy OD today, 08.27.25 - RBA of procedure discussed, questions answered - informed consent obtained and signed - see procedure note   - Begin prednisolone  gtts QID OD x 7 days - Pt given letter to extend leave  - f/u 2-3wks, DFE, OCT  5. Mixed Cataract OU - The symptoms of cataract, surgical options, and treatments and risks were discussed with patient. - discussed diagnosis and progression -- specifically post-vit prog- clear from a retina standpoint to proceed with cataract surgery when pt and surgeon are ready  - referred back to [facility] -- f/u visit scheduled for September   Ophthalmic Meds Ordered this visit:  No orders of the defined types were placed in this encounter.    Return in about 3 weeks (around 11/14/2023) for 2-3 wks f/u laser OD for lattice, Dilated Exam, OCT.  There are no Patient Instructions on file for this visit.  This document serves as a record of services personally performed by Redell JUDITHANN Hans, MD, PhD. It was created on their behalf by Almetta Pesa, an ophthalmic technician. The creation of this record is the provider's dictation and/or activities during the visit.    Electronically signed by: Almetta Pesa, OA, 10/24/23  12:14 PM  Redell JUDITHANN Hans, M.D., Ph.D. Diseases & Surgery of the Retina and Vitreous [facility]  I have reviewed the above documentation for accuracy and completeness, and I agree with the  above. Redell JUDITHANN Hans, M.D., Ph.D. 10/24/23 12:15 PM   Abbreviations: M myopia (nearsighted); A astigmatism; H hyperopia (farsighted); P presbyopia; Mrx spectacle prescription;  CTL contact lenses; OD right eye; OS left eye; OU both eyes  XT exotropia; ET esotropia; PEK punctate epithelial keratitis; PEE punctate epithelial erosions; DES dry eye syndrome; MGD meibomian gland dysfunction; ATs artificial tears; PFAT's preservative free artificial tears; NSC nuclear sclerotic cataract; PSC posterior subcapsular cataract; ERM epi-retinal membrane; PVD posterior vitreous detachment; RD retinal detachment; DM diabetes mellitus; DR diabetic retinopathy; NPDR non-proliferative diabetic retinopathy; PDR proliferative diabetic retinopathy; CSME clinically significant macular edema; DME diabetic macular edema; dbh dot blot hemorrhages; CWS cotton wool spot; POAG primary open angle glaucoma; C/D cup-to-disc ratio; HVF humphrey visual field; GVF goldmann visual field; OCT optical coherence tomography; IOP intraocular pressure; BRVO Branch retinal vein occlusion; CRVO central retinal vein occlusion; CRAO central retinal artery occlusion; BRAO branch retinal artery occlusion; RT retinal tear; SB scleral buckle; PPV pars plana vitrectomy; VH Vitreous hemorrhage; PRP panretinal laser photocoagulation; IVK intravitreal kenalog ; VMT vitreomacular traction; MH Macular hole;  NVD neovascularization of the disc; NVE neovascularization elsewhere; AREDS age related eye  disease study; ARMD age related macular degeneration; POAG primary open angle glaucoma; EBMD epithelial/anterior basement membrane dystrophy; ACIOL anterior chamber intraocular lens; IOL intraocular lens; PCIOL posterior chamber intraocular lens; Phaco/IOL phacoemulsification with intraocular lens placement; PRK photorefractive keratectomy; LASIK laser assisted in situ keratomileusis; HTN hypertension; DM diabetes mellitus; COPD chronic obstructive pulmonary disease

## 2023-10-24 ENCOUNTER — Ambulatory Visit (INDEPENDENT_AMBULATORY_CARE_PROVIDER_SITE_OTHER): Admitting: Ophthalmology

## 2023-10-24 ENCOUNTER — Encounter (INDEPENDENT_AMBULATORY_CARE_PROVIDER_SITE_OTHER): Payer: Self-pay | Admitting: Ophthalmology

## 2023-10-24 DIAGNOSIS — H4312 Vitreous hemorrhage, left eye: Secondary | ICD-10-CM

## 2023-10-24 DIAGNOSIS — H35411 Lattice degeneration of retina, right eye: Secondary | ICD-10-CM

## 2023-10-24 DIAGNOSIS — H33321 Round hole, right eye: Secondary | ICD-10-CM

## 2023-10-24 DIAGNOSIS — H25813 Combined forms of age-related cataract, bilateral: Secondary | ICD-10-CM

## 2023-10-24 DIAGNOSIS — H3322 Serous retinal detachment, left eye: Secondary | ICD-10-CM | POA: Diagnosis not present

## 2023-10-30 NOTE — Progress Notes (Signed)
 [facility] - Clinic Note  11/13/2023   CHIEF COMPLAINT Patient presents for Retina Follow Up  HISTORY OF PRESENT ILLNESS: Briana Lamb is a 63 y.o. female who presents to the clinic today for:  HPI     Retina Follow Up   Patient presents with  Retinal Break/Detachment.  In left eye.  This started 3 weeks ago.  Duration of 3 weeks.  Since onset it is stable.  I, the attending physician,  performed the HPI with the patient and updated documentation appropriately.        Comments    3 week retina follow up  laser retinopexy OD pt is reporting some haze pt denies floaters had one day she saw flashes in OS but has not since  sees [name] September 26th for cat eval       Last edited by Briana Rogue, MD on 11/20/2023 10:02 PM.     Pt states no issues after the laser. Had a small circular floater in OS.   Referring physician: [name], Lamb, OD [address] [place],  [place] [address]  HISTORICAL INFORMATION:  Selected notes from the MEDICAL RECORD NUMBER Referred by Dr. [name] for concern of vitreous hemorrhage OS LEE:  Ocular Hx- PMH-   CURRENT MEDICATIONS: Current Outpatient Medications (Ophthalmic Drugs)  Medication Sig   bacitracin -polymyxin b  (POLYSPORIN ) ophthalmic ointment Place into the right eye 4 (four) times daily. Place a 1/2 inch ribbon of ointment into the lower eyelid.   prednisoLONE  acetate (PRED FORTE ) 1 % ophthalmic suspension Place 1 drop into the left eye 4 (four) times daily.   No current facility-administered medications for this visit. (Ophthalmic Drugs)   Current Outpatient Medications (Other)  Medication Sig   Ascorbic Acid (VITAMIN C) 1000 MG tablet Take 1,000 mg by mouth daily.   b complex vitamins tablet Take 1 tablet by mouth daily.   HYDROcodone -acetaminophen  (NORCO/VICODIN) 5-325 MG tablet Take 1 tablet by mouth every 4 (four) hours as needed.   L-Arginine 500 MG CAPS Take 500 mg by mouth daily.    L-CITRULLINE PO Take 1 capsule by mouth daily.   Multiple Vitamins-Minerals (MULTIVITAMIN WITH MINERALS) tablet Take 1 tablet by mouth daily.   TURMERIC CURCUMIN PO Take 1 capsule by mouth daily.   No current facility-administered medications for this visit. (Other)   REVIEW OF SYSTEMS: ROS   Positive for: Musculoskeletal, Eyes, Allergic/Imm Negative for: Constitutional, Gastrointestinal, Neurological, Skin, Genitourinary, HENT, Endocrine, Cardiovascular, Respiratory, Psychiatric, Heme/Lymph Last edited by Briana Lamb, COT on 11/13/2023  9:01 AM.       ALLERGIES Allergies  Allergen Reactions   Penicillins Swelling and Other (See Comments)    SWELLING REACTION UNSPECIFIED  Has patient had a PCN reaction causing immediate rash, facial/tongue/throat swelling, SOB or lightheadedness with hypotension: unkn Has patient had a PCN reaction causing severe rash involving mucus membranes or skin necrosis: unkn Has patient had a PCN reaction that required hospitalization: unkn Has patient had a PCN reaction occurring within the last 10 years: unkn If all of the above answers are NO, then may proceed with Cephalosporin use.    Bee Venom Swelling    SWELLING LOCALIZED   PAST MEDICAL HISTORY Past Medical History:  Diagnosis Date   Anxiety    situational per pt (about surgery)   Arthritis    knees (01/08/2018)   Bee sting allergy    Seasonal allergies    Past Surgical History:  Procedure Laterality Date  ANKLE FRACTURE SURGERY Right 1995   ANKLE HARDWARE REMOVAL Right ~ 1996   CESAREAN SECTION  1999   FRACTURE SURGERY     JOINT REPLACEMENT     TOTAL KNEE ARTHROPLASTY Left 01/08/2018   TOTAL KNEE ARTHROPLASTY Left 01/08/2018   Procedure: TOTAL KNEE ARTHROPLASTY;  Surgeon: [name], MD;  Location: [facility] OR;  Service: Orthopedics;  Laterality: Left;   VITRECTOMY 25 GAUGE WITH SCLERAL BUCKLE Left 07/19/2023   Procedure: VITRECTOMY, USING 25-GAUGE INSTRUMENTS, WITH  SCLERAL BUCKLING;  Surgeon: Briana Rogue, MD;  Location: [facility] OR;  Service: Ophthalmology;  Laterality: Left;   WRIST FRACTURE SURGERY Right 04/2006   FAMILY HISTORY Family History  Problem Relation Age of Onset   Stroke Maternal Grandfather    SOCIAL HISTORY Social History   Tobacco Use   Smoking status: Former    Current packs/day: 0.00    Average packs/day: 0.5 packs/day for 12.0 years (6.0 ttl pk-yrs)    Types: Cigarettes    Start date: 62    Quit date: 1989    Years since quitting: 36.7   Smokeless tobacco: Never  Vaping Use   Vaping status: Never Used  Substance Use Topics   Alcohol use: Yes    Comment: socially- maybe once a month   Drug use: Never       OPHTHALMIC EXAM:  Base Eye Exam     Visual Acuity (Snellen - Linear)       Right Left   Dist cc 20/20 -2 20/350 -2   Dist ph cc  20/100 -1         Tonometry (Tonopen, 9:08 AM)       Right Left   Pressure 13 14         Pupils       Pupils Dark Light Shape React APD   Right PERRL 3 2 Round Brisk None   Left PERRL 3 2 Round Brisk None         Visual Fields       Left Right    Full Full         Extraocular Movement       Right Left    Full, Ortho Full, Ortho         Neuro/Psych     Oriented x3: Yes   Mood/Affect: Normal         Dilation     Both eyes: 2.5% Phenylephrine  @ 9:08 AM           Slit Lamp and Fundus Exam     External Exam       Right Left   External  Mild lid edema, periorbital edema         Slit Lamp Exam       Right Left   Lids/Lashes Dermatochalasis - upper lid Dermatochalasis - upper lid   Conjunctiva/Sclera Small nasal pingeucula temporal pinguecula, Subconjunctival hemorrhage -- improved, Suture dissolving temporally   Cornea trace PEE mild arcus, 1-2+ PEE   Anterior Chamber deep, clear, narrow temporal angle Deep and clear, no cell or flare, narrow angles   Iris Round and dilated, No NVI Irreg dilation, PS @ 730, No NVI   Lens 2+  Nuclear sclerosis, 2+ Cortical cataract 3+ Nuclear sclerosis with brunescence, 2-3+ Cortical cataract, 3+PSC, pigment on anterior capsule   Anterior Vitreous syneresis, vitreous condensations, Posterior vitreous detachment Post vitrectomy, VH cleared, 3% gas bubble         Fundus Exam  Right Left   Disc Pink and Sharp, temporal PPA Pink and Sharp   C/D Ratio 0.3 0.2   Macula Flat, Good foveal reflex, RPE mottling, No heme or edema Flat, Blunted foveal reflex, RPE mottling, No heme or edema, mild ERM w/ thickening   Vessels Attenuated, Tortuous attenuated, mild tortuosity   Periphery Attached, pigmented lattice @ 630-good laser changes surrounding, no new RT/RD or lattice retina attached over buckle, good buckle height, good laser over buckle and surrounding retinal breaks at 1030, mild ERM           IMAGING AND PROCEDURES  Imaging and Procedures for 11/13/2023  OCT, Retina - OU - Both Eyes       Right Eye Quality was good. Central Foveal Thickness: 295. Progression has been stable. Findings include normal foveal contour, no IRF, no SRF.   Left Eye Quality was borderline. Central Foveal Thickness: 436. Progression has been stable. Findings include no SRF, abnormal foveal contour (Retina reattached; Patchy ellipsoid signal, ERM with retinal thickening IN macula, focal cystic changes centrally ).   Notes *Images captured and stored on drive  Diagnosis / Impression:  OD: NFP, no IRF/SRF OS: Retina reattached; Patchy ellipsoid signal, ERM with retinal thickening IN macula, focal cystic changes centrally   Clinical management:  See below  Abbreviations: NFP - Normal foveal profile. CME - cystoid macular edema. PED - pigment epithelial detachment. IRF - intraretinal fluid. SRF - subretinal fluid. EZ - ellipsoid zone. ERM - epiretinal membrane. ORA - outer retinal atrophy. ORT - outer retinal tubulation. SRHM - subretinal hyper-reflective material. IRHM - intraretinal  hyper-reflective material           ASSESSMENT/PLAN:   ICD-10-CM   1. Left retinal detachment  H33.22 OCT, Retina - OU - Both Eyes    2. 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 Retinal hole of right eye  H33.321     5. Combined forms of age-related cataract of both eyes  H25.813      Rhegmatogenous retinal detachment, OS - bullous total retinal detachment w/ small retinal breaks at 1030 hidden behind vit hemorrhage - now POM4 s/p PPV/POC/EL/FAX/14% C3F8 OS, 05.22.2025 - doing well - retina attached and in good position -- good buckle height and laser around breaks - Gas bubble gone - IOP 14 - prednisolone -pt on 1 gtt daily OS-can discontinue today.  - cont AT's QID - f/u 3 months, DFE, OCT  2. Vitreous hemorrhage OS  - VH onset Friday, 04.18.25 while vacationing in [place]  - s/p IVA OS #1 (04.22.25) -- no improvement  - now s/p PPV OS as above  - VH cleared post-vitrectomy  3,4. Lattice degeneration right eye - small patch of pigmented lattice at 0630 - asymptomatic - discussed findings, prognosis, and treatment options including observation - s/p Laser Retinopexy OD 08.27.25 - good laser in place - no new RT/RD  5. Mixed Cataract OU - The symptoms of cataract, surgical options, and treatments and risks were discussed with patient. - discussed diagnosis and progression -- specifically post-vit prog - clear from a retina standpoint to proceed with cataract surgery when pt and surgeon are ready  - referred back to [facility] -- f/u visit scheduled for September 26th   Ophthalmic Meds Ordered this visit:  No orders of the defined types were placed in this encounter.    Return in about 3  months (around 02/12/2024) for RD OS, DFE, OCT.  There are no Patient Instructions on file for this visit.  This document serves as a record of services personally performed by Redell JUDITHANN Hans, MD, PhD.  It was created on their behalf by Wanda GEANNIE Keens, COT an ophthalmic technician. The creation of this record is the provider's dictation and/or activities during the visit.    Electronically signed by:  Wanda GEANNIE Keens, COT  11/20/23 10:04 PM  This document serves as a record of services personally performed by Redell JUDITHANN Hans, MD, PhD. It was created on their behalf by Almetta Pesa, an ophthalmic technician. The creation of this record is the provider's dictation and/or activities during the visit.    Electronically signed by: Almetta Pesa, OA, 11/20/23  10:04 PM  Redell JUDITHANN Hans, M.D., Ph.D. Diseases & Surgery of the Retina and Vitreous [facility]  I have reviewed the above documentation for accuracy and completeness, and I agree with the above. Redell JUDITHANN Hans, M.D., Ph.D. 11/20/23 10:06 PM    Abbreviations: M myopia (nearsighted); A astigmatism; H hyperopia (farsighted); P presbyopia; Mrx spectacle prescription;  CTL contact lenses; OD right eye; OS left eye; OU both eyes  XT exotropia; ET esotropia; PEK punctate epithelial keratitis; PEE punctate epithelial erosions; DES dry eye syndrome; MGD meibomian gland dysfunction; ATs artificial tears; PFAT's preservative free artificial tears; NSC nuclear sclerotic cataract; PSC posterior subcapsular cataract; ERM epi-retinal membrane; PVD posterior vitreous detachment; RD retinal detachment; DM diabetes mellitus; DR diabetic retinopathy; NPDR non-proliferative diabetic retinopathy; PDR proliferative diabetic retinopathy; CSME clinically significant macular edema; DME diabetic macular edema; dbh dot blot hemorrhages; CWS cotton wool spot; POAG primary open angle glaucoma; C/D cup-to-disc ratio; HVF humphrey visual field; GVF goldmann visual field; OCT optical coherence tomography; IOP intraocular pressure; BRVO Branch retinal vein occlusion; CRVO central retinal vein occlusion; CRAO central retinal artery occlusion;  BRAO branch retinal artery occlusion; RT retinal tear; SB scleral buckle; PPV pars plana vitrectomy; VH Vitreous hemorrhage; PRP panretinal laser photocoagulation; IVK intravitreal kenalog ; VMT vitreomacular traction; MH Macular hole;  NVD neovascularization of the disc; NVE neovascularization elsewhere; AREDS age related eye disease study; ARMD age related macular degeneration; POAG primary open angle glaucoma; EBMD epithelial/anterior basement membrane dystrophy; ACIOL anterior chamber intraocular lens; IOL intraocular lens; PCIOL posterior chamber intraocular lens; Phaco/IOL phacoemulsification with intraocular lens placement; PRK photorefractive keratectomy; LASIK laser assisted in situ keratomileusis; HTN hypertension; DM diabetes mellitus; COPD chronic obstructive pulmonary disease

## 2023-11-13 ENCOUNTER — Ambulatory Visit (INDEPENDENT_AMBULATORY_CARE_PROVIDER_SITE_OTHER): Admitting: Ophthalmology

## 2023-11-13 ENCOUNTER — Encounter (INDEPENDENT_AMBULATORY_CARE_PROVIDER_SITE_OTHER): Payer: Self-pay | Admitting: Ophthalmology

## 2023-11-13 DIAGNOSIS — H35411 Lattice degeneration of retina, right eye: Secondary | ICD-10-CM

## 2023-11-13 DIAGNOSIS — H25813 Combined forms of age-related cataract, bilateral: Secondary | ICD-10-CM | POA: Diagnosis not present

## 2023-11-13 DIAGNOSIS — H4312 Vitreous hemorrhage, left eye: Secondary | ICD-10-CM | POA: Diagnosis not present

## 2023-11-13 DIAGNOSIS — H3322 Serous retinal detachment, left eye: Secondary | ICD-10-CM | POA: Diagnosis not present

## 2023-11-13 DIAGNOSIS — H33321 Round hole, right eye: Secondary | ICD-10-CM

## 2023-11-20 ENCOUNTER — Encounter (INDEPENDENT_AMBULATORY_CARE_PROVIDER_SITE_OTHER): Payer: Self-pay | Admitting: Ophthalmology

## 2024-01-16 ENCOUNTER — Ambulatory Visit (INDEPENDENT_AMBULATORY_CARE_PROVIDER_SITE_OTHER): Admitting: Internal Medicine

## 2024-01-16 ENCOUNTER — Encounter: Payer: Self-pay | Admitting: Internal Medicine

## 2024-01-16 VITALS — BP 120/70 | HR 85 | Temp 97.9°F | Ht 67.0 in | Wt 242.5 lb

## 2024-01-16 DIAGNOSIS — Z114 Encounter for screening for human immunodeficiency virus [HIV]: Secondary | ICD-10-CM

## 2024-01-16 DIAGNOSIS — Z780 Asymptomatic menopausal state: Secondary | ICD-10-CM

## 2024-01-16 DIAGNOSIS — Z1211 Encounter for screening for malignant neoplasm of colon: Secondary | ICD-10-CM

## 2024-01-16 DIAGNOSIS — Z124 Encounter for screening for malignant neoplasm of cervix: Secondary | ICD-10-CM

## 2024-01-16 DIAGNOSIS — Z23 Encounter for immunization: Secondary | ICD-10-CM

## 2024-01-16 DIAGNOSIS — Z1231 Encounter for screening mammogram for malignant neoplasm of breast: Secondary | ICD-10-CM

## 2024-01-16 DIAGNOSIS — Z0000 Encounter for general adult medical examination without abnormal findings: Secondary | ICD-10-CM

## 2024-01-16 DIAGNOSIS — Z1159 Encounter for screening for other viral diseases: Secondary | ICD-10-CM

## 2024-01-16 LAB — CBC WITH DIFFERENTIAL/PLATELET
Basophils Absolute: 0.1 K/uL (ref 0.0–0.1)
Basophils Relative: 2.8 % (ref 0.0–3.0)
Eosinophils Absolute: 0.1 K/uL (ref 0.0–0.7)
Eosinophils Relative: 2.8 % (ref 0.0–5.0)
HCT: 29.9 % — ABNORMAL LOW (ref 36.0–46.0)
Hemoglobin: 10.5 g/dL — ABNORMAL LOW (ref 12.0–15.0)
Lymphocytes Relative: 35 % (ref 12.0–46.0)
Lymphs Abs: 1.8 K/uL (ref 0.7–4.0)
MCHC: 35 g/dL (ref 30.0–36.0)
MCV: 107.9 fl — ABNORMAL HIGH (ref 78.0–100.0)
Monocytes Absolute: 0.3 K/uL (ref 0.1–1.0)
Monocytes Relative: 6.3 % (ref 3.0–12.0)
Neutro Abs: 2.8 K/uL (ref 1.4–7.7)
Neutrophils Relative %: 53.1 % (ref 43.0–77.0)
Platelets: 512 K/uL — ABNORMAL HIGH (ref 150.0–400.0)
RBC: 2.77 Mil/uL — ABNORMAL LOW (ref 3.87–5.11)
RDW: 13.9 % (ref 11.5–15.5)
WBC: 5.3 K/uL (ref 4.0–10.5)

## 2024-01-16 LAB — LIPID PANEL
Cholesterol: 162 mg/dL (ref 0–200)
HDL: 46.6 mg/dL (ref >39.00)
LDL Cholesterol: 99 mg/dL (ref 0–99)
NonHDL: 115.43
Total CHOL/HDL Ratio: 3
Triglycerides: 83 mg/dL (ref 0.0–149.0)
VLDL: 16.6 mg/dL (ref 0.0–40.0)

## 2024-01-16 LAB — COMPREHENSIVE METABOLIC PANEL WITH GFR
ALT: 11 U/L (ref 0–35)
AST: 16 U/L (ref 0–37)
Albumin: 4.4 g/dL (ref 3.5–5.2)
Alkaline Phosphatase: 58 U/L (ref 39–117)
BUN: 12 mg/dL (ref 6–23)
CO2: 27 meq/L (ref 19–32)
Calcium: 9.4 mg/dL (ref 8.4–10.5)
Chloride: 104 meq/L (ref 96–112)
Creatinine, Ser: 0.67 mg/dL (ref 0.40–1.20)
GFR: 91.65 mL/min (ref >60.00)
Glucose, Bld: 94 mg/dL (ref 70–99)
Potassium: 4.1 meq/L (ref 3.5–5.1)
Sodium: 139 meq/L (ref 135–145)
Total Bilirubin: 0.9 mg/dL (ref 0.2–1.2)
Total Protein: 7.1 g/dL (ref 6.0–8.3)

## 2024-01-16 LAB — HEMOGLOBIN A1C: Hgb A1c MFr Bld: 5.3 % (ref 4.6–6.5)

## 2024-01-16 LAB — VITAMIN B12: Vitamin B-12: 586 pg/mL (ref 211–911)

## 2024-01-16 LAB — VITAMIN D 25 HYDROXY (VIT D DEFICIENCY, FRACTURES): VITD: 28.48 ng/mL — ABNORMAL LOW (ref 30.00–100.00)

## 2024-01-16 LAB — TSH: TSH: 3.16 u[IU]/mL (ref 0.35–5.50)

## 2024-01-16 NOTE — Addendum Note (Signed)
 Addended by: KATHRYNE MILLMAN B on: 01/16/2024 04:28 PM   Modules accepted: Orders

## 2024-01-16 NOTE — Progress Notes (Addendum)
 New Patient Office Visit     CC/Reason for Visit: Establish care, annual preventive exam Previous PCP: Mikel Fila, MD Last Visit: 2021  HPI: Briana Lamb is a 63 y.o. female who is coming in today for the above mentioned reasons. Past Medical History is significant for: Obesity.  Recently had a left retinal detachment and is being followed by Dr. Valdemar.  Quit smoking in 1997, drinks only occasionally, allergies to penicillin which causes hives, past surgical history significant for left total knee replacement.  She is due for all age-appropriate vaccinations and cancer screening.   Past Medical/Surgical History: Past Medical History:  Diagnosis Date   Anxiety    situational per pt (about surgery)   Arthritis    knees (01/08/2018)   Bee sting allergy    Seasonal allergies     Past Surgical History:  Procedure Laterality Date   ANKLE FRACTURE SURGERY Right 1995   ANKLE HARDWARE REMOVAL Right ~ 1996   CESAREAN SECTION  1999   FRACTURE SURGERY     JOINT REPLACEMENT     TOTAL KNEE ARTHROPLASTY Left 01/08/2018   TOTAL KNEE ARTHROPLASTY Left 01/08/2018   Procedure: TOTAL KNEE ARTHROPLASTY;  Surgeon: [name], MD;  Location: [facility] OR;  Service: Orthopedics;  Laterality: Left;   VITRECTOMY 25 GAUGE WITH SCLERAL BUCKLE Left 07/19/2023   Procedure: VITRECTOMY, USING 25-GAUGE INSTRUMENTS, WITH SCLERAL BUCKLING;  Surgeon: [name], MD;  Location: [facility] OR;  Service: Ophthalmology;  Laterality: Left;   WRIST FRACTURE SURGERY Right 04/2006    Social History:  reports that she quit smoking about 36 years ago. Her smoking use included cigarettes. She started smoking about 48 years ago. She has a 6 pack-year smoking history. She has never used smokeless tobacco. She reports current alcohol use. She reports that she does not use drugs.  Allergies: Allergies  Allergen Reactions   Penicillins Swelling and Other (See Comments)    SWELLING REACTION UNSPECIFIED  Has  patient had a PCN reaction causing immediate rash, facial/tongue/throat swelling, SOB or lightheadedness with hypotension: unkn Has patient had a PCN reaction causing severe rash involving mucus membranes or skin necrosis: unkn Has patient had a PCN reaction that required hospitalization: unkn Has patient had a PCN reaction occurring within the last 10 years: unkn If all of the above answers are NO, then may proceed with Cephalosporin use.    Bee Venom Swelling    SWELLING LOCALIZED    Family History:  Family History  Problem Relation Age of Onset   Stroke Maternal Grandfather      Current Outpatient Medications:    Ascorbic Acid (VITAMIN C) 1000 MG tablet, Take 1,000 mg by mouth daily., Disp: , Rfl:    b complex vitamins tablet, Take 1 tablet by mouth daily., Disp: , Rfl:    HYDROcodone -acetaminophen  (NORCO/VICODIN) 5-325 MG tablet, Take 1 tablet by mouth every 4 (four) hours as needed., Disp: 20 tablet, Rfl: 0   L-Arginine 500 MG CAPS, Take 500 mg by mouth daily., Disp: , Rfl:    L-CITRULLINE PO, Take 1 capsule by mouth daily., Disp: , Rfl:    Multiple Vitamins-Minerals (MULTIVITAMIN WITH MINERALS) tablet, Take 1 tablet by mouth daily., Disp: , Rfl:    prednisoLONE  acetate (PRED FORTE ) 1 % ophthalmic suspension, Place 1 drop into the left eye 4 (four) times daily., Disp: 15 mL, Rfl: 0   TURMERIC CURCUMIN PO, Take 1 capsule by mouth daily., Disp: , Rfl:    bacitracin -polymyxin b  (POLYSPORIN ) ophthalmic ointment,  Place into the right eye 4 (four) times daily. Place a 1/2 inch ribbon of ointment into the lower eyelid. (Patient not taking: Reported on 01/16/2024), Disp: 3.5 g, Rfl: 5  Review of Systems:  Negative except as indicated in HPI.   Physical Exam: Vitals:   01/16/24 1000  BP: 120/70  Pulse: 85  Temp: 97.9 F (36.6 C)  TempSrc: Oral  SpO2: 97%  Weight: 242 lb 8 oz (110 kg)  Height: 5' 7 (1.702 m)   Body mass index is 37.98 kg/m.  Physical Exam Vitals  reviewed.  Constitutional:      General: She is not in acute distress.    Appearance: Normal appearance. She is obese. She is not ill-appearing, toxic-appearing or diaphoretic.  HENT:     Head: Normocephalic.     Right Ear: Tympanic membrane, ear canal and external ear normal. There is no impacted cerumen.     Left Ear: Tympanic membrane, ear canal and external ear normal. There is no impacted cerumen.     Nose: Nose normal.     Mouth/Throat:     Mouth: Mucous membranes are moist.     Pharynx: Oropharynx is clear. No oropharyngeal exudate or posterior oropharyngeal erythema.  Eyes:     General: No scleral icterus.       Right eye: No discharge.        Left eye: No discharge.     Conjunctiva/sclera: Conjunctivae normal.  Neck:     Vascular: No carotid bruit.  Cardiovascular:     Rate and Rhythm: Normal rate and regular rhythm.     Pulses: Normal pulses.     Heart sounds: Normal heart sounds.  Pulmonary:     Effort: Pulmonary effort is normal. No respiratory distress.     Breath sounds: Normal breath sounds.  Abdominal:     General: Abdomen is flat. Bowel sounds are normal.     Palpations: Abdomen is soft.  Musculoskeletal:        General: Normal range of motion.     Cervical back: Normal range of motion.  Skin:    General: Skin is warm and dry.  Neurological:     General: No focal deficit present.     Mental Status: She is alert and oriented to person, place, and time. Mental status is at baseline.  Psychiatric:        Mood and Affect: Mood normal.        Behavior: Behavior normal.        Thought Content: Thought content normal.        Judgment: Judgment normal.       Impression and Plan:  Encounter for screening mammogram for malignant neoplasm of breast -     Digital Screening Mammogram, Left and Right; Future  Encounter for osteoporosis screening in asymptomatic postmenopausal patient -     DG Bone Density; Future  Screening for colon cancer -     Ambulatory  referral to Gastroenterology  Screening for cervical cancer -     Ambulatory referral to Gynecology  Immunization due  Encounter for preventive health examination -     CBC with Differential/Platelet; Future -     Comprehensive metabolic panel with GFR; Future -     Hemoglobin A1c; Future -     Lipid panel; Future -     TSH; Future -     Vitamin B12; Future -     VITAMIN D 25 Hydroxy (Vit-D Deficiency, Fractures); Future  Encounter for hepatitis  C screening test for low risk patient -     Hepatitis C antibody; Future  Encounter for screening for HIV -     HIV Antibody (routine testing w rflx); Future    -Recommend routine eye and dental care. -Healthy lifestyle discussed in detail. -Labs to be updated today. -Prostate cancer screening: Not applicable Health Maintenance  Topic Date Due   HIV Screening  Never done   Hepatitis C Screening  Never done   Breast Cancer Screening  Never done   Colon Cancer Screening  Never done   Pneumococcal Vaccine for age over 33 (1 of 1 - PCV) Never done   Zoster (Shingles) Vaccine (1 of 2) Never done   Pap with HPV screening  11/29/2010   DTaP/Tdap/Td vaccine (2 - Td or Tdap) 11/28/2017   DEXA scan (bone density measurement)  07/07/2023   Flu Shot  Never done   COVID-19 Vaccine (3 - 2025-26 season) 10/29/2023   Hepatitis B Vaccine  Aged Out   Meningitis B Vaccine  Aged Out    - First shingles and Tdap vaccines today, declines all other immunizations today. - Referrals placed for mammogram, colonoscopy, Pap smear and bone density.    Tully Theophilus Andrews, MD [facility]

## 2024-01-17 ENCOUNTER — Ambulatory Visit (INDEPENDENT_AMBULATORY_CARE_PROVIDER_SITE_OTHER)
Admission: RE | Admit: 2024-01-17 | Discharge: 2024-01-17 | Disposition: A | Discharge status: Home / Self Care | Source: Ambulatory Visit | Attending: Internal Medicine | Admitting: Internal Medicine

## 2024-01-17 DIAGNOSIS — Z780 Asymptomatic menopausal state: Secondary | ICD-10-CM | POA: Diagnosis not present

## 2024-01-17 DIAGNOSIS — Z13820 Encounter for screening for osteoporosis: Secondary | ICD-10-CM

## 2024-01-17 LAB — HIV ANTIBODY (ROUTINE TESTING W REFLEX)
HIV 1&2 Ab, 4th Generation: NONREACTIVE
HIV FINAL INTERPRETATION: NEGATIVE

## 2024-01-17 LAB — HEPATITIS C ANTIBODY: Hepatitis C Ab: NONREACTIVE

## 2024-01-21 ENCOUNTER — Encounter: Payer: Self-pay | Admitting: Internal Medicine

## 2024-01-21 ENCOUNTER — Ambulatory Visit: Payer: Self-pay | Admitting: Internal Medicine

## 2024-01-21 DIAGNOSIS — E559 Vitamin D deficiency, unspecified: Secondary | ICD-10-CM

## 2024-01-21 DIAGNOSIS — D539 Nutritional anemia, unspecified: Secondary | ICD-10-CM

## 2024-01-21 MED ORDER — VITAMIN D (ERGOCALCIFEROL) 1.25 MG (50000 UNIT) PO CAPS: 50000.0000 [IU] | ORAL_CAPSULE | ORAL | 0 refills | Status: AC

## 2024-01-23 ENCOUNTER — Other Ambulatory Visit

## 2024-01-23 ENCOUNTER — Other Ambulatory Visit (INDEPENDENT_AMBULATORY_CARE_PROVIDER_SITE_OTHER)

## 2024-01-23 ENCOUNTER — Ambulatory Visit: Admitting: Internal Medicine

## 2024-01-23 DIAGNOSIS — D539 Nutritional anemia, unspecified: Secondary | ICD-10-CM | POA: Diagnosis not present

## 2024-01-23 DIAGNOSIS — E559 Vitamin D deficiency, unspecified: Secondary | ICD-10-CM | POA: Diagnosis not present

## 2024-01-23 LAB — IBC + FERRITIN
Ferritin: 329.7 ng/mL — ABNORMAL HIGH (ref 10.0–291.0)
Iron: 141 ug/dL (ref 42–145)
Saturation Ratios: 58.9 % — ABNORMAL HIGH (ref 20.0–50.0)
TIBC: 239.4 ug/dL — ABNORMAL LOW (ref 250.0–450.0)
Transferrin: 171 mg/dL — ABNORMAL LOW (ref 212.0–360.0)

## 2024-01-23 LAB — VITAMIN D 25 HYDROXY (VIT D DEFICIENCY, FRACTURES): VITD: 29.18 ng/mL — ABNORMAL LOW (ref 30.00–100.00)

## 2024-01-26 LAB — FOLATE RBC: RBC Folate: 801 ng/mL (ref >280)

## 2024-01-28 ENCOUNTER — Ambulatory Visit: Payer: Self-pay | Admitting: Internal Medicine

## 2024-01-28 DIAGNOSIS — E559 Vitamin D deficiency, unspecified: Secondary | ICD-10-CM

## 2024-01-29 NOTE — Progress Notes (Signed)
 [facility] - Clinic Note  02/12/2024   CHIEF COMPLAINT Patient presents for Retina Follow Up  HISTORY OF PRESENT ILLNESS: Briana Lamb is a 63 y.o. female who presents to the clinic today for:  HPI     Retina Follow Up   Patient presents with  Retinal Break/Detachment.  In left eye.  This started 8 months ago.  Duration of 3 months.  Since onset it is stable.  I, the attending physician,  performed the HPI with the patient and updated documentation appropriately.        Comments   Pt had Cat Sx OS 01/29/24 Pt had YAG OS 02/04/24 Pt is using moxifloxacin TID OS and Pred Forte  OS TID and continuing to taper. Pt is also using Systane PRN. Pt states VA is better since last visit.       Last edited by Fowler, Alana D on 02/12/2024  9:26 AM.     Pt states the vision is not any better after cataract surgery.  Referring physician: [name], Chad, OD [address] [place],  [place] [address]  HISTORICAL INFORMATION:  Selected notes from the MEDICAL RECORD NUMBER Referred by Dr. [name] for concern of vitreous hemorrhage OS LEE:  Ocular Hx- PMH-   CURRENT MEDICATIONS: Current Outpatient Medications (Ophthalmic Drugs)  Medication Sig   bacitracin -polymyxin b  (POLYSPORIN ) ophthalmic ointment Place into the right eye 4 (four) times daily. Place a 1/2 inch ribbon of ointment into the lower eyelid. (Patient not taking: Reported on 01/16/2024)   prednisoLONE  acetate (PRED FORTE ) 1 % ophthalmic suspension Place 1 drop into the left eye 4 (four) times daily.   No current facility-administered medications for this visit. (Ophthalmic Drugs)   Current Outpatient Medications (Other)  Medication Sig   Ascorbic Acid (VITAMIN C) 1000 MG tablet Take 1,000 mg by mouth daily.   b complex vitamins tablet Take 1 tablet by mouth daily.   HYDROcodone -acetaminophen  (NORCO/VICODIN) 5-325 MG tablet Take 1 tablet by mouth every 4 (four) hours as needed.    L-Arginine 500 MG CAPS Take 500 mg by mouth daily.   L-CITRULLINE PO Take 1 capsule by mouth daily.   Multiple Vitamins-Minerals (MULTIVITAMIN WITH MINERALS) tablet Take 1 tablet by mouth daily.   TURMERIC CURCUMIN PO Take 1 capsule by mouth daily.   Vitamin D , Ergocalciferol , (DRISDOL ) 1.25 MG (50000 UNIT) CAPS capsule Take 1 capsule (50,000 Units total) by mouth every 7 (seven) days for 12 doses.   No current facility-administered medications for this visit. (Other)   REVIEW OF SYSTEMS:     ALLERGIES Allergies  Allergen Reactions   Penicillins Swelling and Other (See Comments)    SWELLING REACTION UNSPECIFIED  Has patient had a PCN reaction causing immediate rash, facial/tongue/throat swelling, SOB or lightheadedness with hypotension: unkn Has patient had a PCN reaction causing severe rash involving mucus membranes or skin necrosis: unkn Has patient had a PCN reaction that required hospitalization: unkn Has patient had a PCN reaction occurring within the last 10 years: unkn If all of the above answers are NO, then may proceed with Cephalosporin use.    Bee Venom Swelling    SWELLING LOCALIZED   PAST MEDICAL HISTORY Past Medical History:  Diagnosis Date   Anxiety    situational per pt (about surgery)   Arthritis    knees (01/08/2018)   Bee sting allergy    Seasonal allergies    Past Surgical History:  Procedure Laterality Date   ANKLE FRACTURE SURGERY Right  1995   ANKLE HARDWARE REMOVAL Right ~ 1996   CESAREAN SECTION  1999   FRACTURE SURGERY     JOINT REPLACEMENT     TOTAL KNEE ARTHROPLASTY Left 01/08/2018   TOTAL KNEE ARTHROPLASTY Left 01/08/2018   Procedure: TOTAL KNEE ARTHROPLASTY;  Surgeon: [name], MD;  Location: [facility] OR;  Service: Orthopedics;  Laterality: Left;   VITRECTOMY 25 GAUGE WITH SCLERAL BUCKLE Left 07/19/2023   Procedure: VITRECTOMY, USING 25-GAUGE INSTRUMENTS, WITH SCLERAL BUCKLING;  Surgeon: [name], MD;  Location: [facility] OR;  Service:  Ophthalmology;  Laterality: Left;   WRIST FRACTURE SURGERY Right 04/2006   FAMILY HISTORY Family History  Problem Relation Age of Onset   Stroke Maternal Grandfather    SOCIAL HISTORY Social History   Tobacco Use   Smoking status: Former    Current packs/day: 0.00    Average packs/day: 0.5 packs/day for 12.0 years (6.0 ttl pk-yrs)    Types: Cigarettes    Start date: 57    Quit date: 1989    Years since quitting: 36.9   Smokeless tobacco: Never  Vaping Use   Vaping status: Never Used  Substance Use Topics   Alcohol use: Yes    Comment: socially- maybe once a month   Drug use: Never       OPHTHALMIC EXAM:  Base Eye Exam     Visual Acuity (Snellen - Linear)       Right Left   Dist cc 20/20 20/150   Dist ph cc  20/70 -2    Correction: Glasses         Tonometry (Tonopen, 9:22 AM)       Right Left   Pressure 19 14         Pupils       Pupils Dark Light Shape React APD   Right PERRL 3 2 Round Brisk None   Left PERRL 5 5 Round none None         Visual Fields       Left Right    Full Full         Extraocular Movement       Right Left    Full, Ortho Full, Ortho         Neuro/Psych     Oriented x3: Yes   Mood/Affect: Normal         Dilation     Both eyes: 1.0% Mydriacyl , 2.5% Phenylephrine  @ 9:23 AM           Slit Lamp and Fundus Exam     External Exam       Right Left   External  Mild lid edema, periorbital edema         Slit Lamp Exam       Right Left   Lids/Lashes Dermatochalasis - upper lid Dermatochalasis - upper lid   Conjunctiva/Sclera Small nasal pingeucula temporal pinguecula, Subconjunctival hemorrhage -- improved, Suture dissolving temporally   Cornea trace PEE mild arcus, 1-2+ PEE   Anterior Chamber deep, clear, narrow temporal angle Deep and clear, no cell or flare, narrow angles   Iris Round and dilated, No NVI Irreg dilation, PS @ 730, No NVI   Lens 2+ Nuclear sclerosis, 2+ Cortical cataract 3+  Nuclear sclerosis with brunescence, 2-3+ Cortical cataract, 3+PSC, pigment on anterior capsule   Anterior Vitreous syneresis, vitreous condensations, Posterior vitreous detachment Post vitrectomy, VH cleared, gas bubble gone         Fundus Exam  Right Left   Disc Pink and Sharp, temporal PPA Pink and Sharp   C/D Ratio 0.3 0.2   Macula Flat, Good foveal reflex, RPE mottling, No heme or edema Flat, Blunted foveal reflex, RPE mottling, No heme or edema, mild ERM w/ thickening   Vessels Attenuated, Tortuous attenuated, mild tortuosity   Periphery Attached, pigmented lattice @ 630-good laser changes surrounding, no new RT/RD or lattice retina attached over buckle, good buckle height, good laser over buckle and surrounding retinal breaks at 1030, mild ERM           Refraction     Wearing Rx       Sphere Cylinder Axis Add   Right -3.00 +1.75 035 +2.50   Left -0.75 +0.75 088 +2.50    Type: Progressive           IMAGING AND PROCEDURES  Imaging and Procedures for 02/12/2024         ASSESSMENT/PLAN:   ICD-10-CM   1. Left retinal detachment  H33.22 OCT, Retina - OU - Both Eyes    2. 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 Retinal hole of right eye  H33.321     5. Combined forms of age-related cataract of both eyes  H25.813      Rhegmatogenous retinal detachment, OS - bullous total retinal detachment w/ small retinal breaks at 1030 hidden behind vit hemorrhage - now POM4 s/p PPV/POC/EL/FAX/14% C3F8 OS, 05.22.2025 - doing well - retina attached and in good position -- good buckle height and laser around breaks - Gas bubble gone - IOP 14 - prednisolone -pt on 1 gtt daily OS-can discontinue today.  - cont AT's QID - f/u 6 months, DFE, OCT  2. Vitreous hemorrhage OS  - VH onset Friday, 04.18.25 while vacationing in [place]  - s/p IVA OS #1 (04.22.25) -- no improvement  - now s/p PPV OS as  above  - VH cleared post-vitrectomy  3,4. Lattice degeneration right eye - small patch of pigmented lattice at 0630 - asymptomatic - discussed findings, prognosis, and treatment options including observation - s/p Laser Retinopexy OD 08.27.25 - good laser in place - no new RT/RD  5. Epiretinal membrane, left eye  - asymptomatic, no metamorphopsia - OCT shows Retina reattached; Patchy ellipsoid signal, ERM with retinal thickening IN macula, focal cystic changes centrally --slightly improved - no indication for surgery at this time - monitor for now - f/u 6 mos -- DFE/OCT   5. Mixed Cataract OD - The symptoms of cataract, surgical options, and treatments and risks were discussed with patient. - discussed diagnosis and progression -- specifically post-vit prog - clear from a retina standpoint to proceed with cataract surgery when pt and surgeon are ready  - referred back to [facility] -- f/u visit scheduled for September 26th   Ophthalmic Meds Ordered this visit:  No orders of the defined types were placed in this encounter.    No follow-ups on file.  There are no Patient Instructions on file for this visit.  This document serves as a record of services personally performed by Redell JUDITHANN Hans, MD, PhD. It was created on their behalf by Wanda GEANNIE Keens, COT an ophthalmic technician. The creation of this record is the provider's dictation and/or activities during the visit.    Electronically signed by:  Wanda GEANNIE Keens, COT  02/12/2024 10:44 AM  Redell MATSU.  Valdemar, M.D., Ph.D. Diseases & Surgery of the Retina and Vitreous [facility]   Abbreviations: M myopia (nearsighted); A astigmatism; H hyperopia (farsighted); P presbyopia; Mrx spectacle prescription;  CTL contact lenses; OD right eye; OS left eye; OU both eyes  XT exotropia; ET esotropia; PEK punctate epithelial keratitis; PEE punctate epithelial erosions; DES dry eye syndrome; MGD meibomian  gland dysfunction; ATs artificial tears; PFAT's preservative free artificial tears; NSC nuclear sclerotic cataract; PSC posterior subcapsular cataract; ERM epi-retinal membrane; PVD posterior vitreous detachment; RD retinal detachment; DM diabetes mellitus; DR diabetic retinopathy; NPDR non-proliferative diabetic retinopathy; PDR proliferative diabetic retinopathy; CSME clinically significant macular edema; DME diabetic macular edema; dbh dot blot hemorrhages; CWS cotton wool spot; POAG primary open angle glaucoma; C/D cup-to-disc ratio; HVF humphrey visual field; GVF goldmann visual field; OCT optical coherence tomography; IOP intraocular pressure; BRVO Branch retinal vein occlusion; CRVO central retinal vein occlusion; CRAO central retinal artery occlusion; BRAO branch retinal artery occlusion; RT retinal tear; SB scleral buckle; PPV pars plana vitrectomy; VH Vitreous hemorrhage; PRP panretinal laser photocoagulation; IVK intravitreal kenalog ; VMT vitreomacular traction; MH Macular hole;  NVD neovascularization of the disc; NVE neovascularization elsewhere; AREDS age related eye disease study; ARMD age related macular degeneration; POAG primary open angle glaucoma; EBMD epithelial/anterior basement membrane dystrophy; ACIOL anterior chamber intraocular lens; IOL intraocular lens; PCIOL posterior chamber intraocular lens; Phaco/IOL phacoemulsification with intraocular lens placement; PRK photorefractive keratectomy; LASIK laser assisted in situ keratomileusis; HTN hypertension; DM diabetes mellitus; COPD chronic obstructive pulmonary disease

## 2024-02-12 ENCOUNTER — Encounter (INDEPENDENT_AMBULATORY_CARE_PROVIDER_SITE_OTHER): Payer: Self-pay | Admitting: Ophthalmology

## 2024-02-12 ENCOUNTER — Ambulatory Visit (INDEPENDENT_AMBULATORY_CARE_PROVIDER_SITE_OTHER): Admitting: Ophthalmology

## 2024-02-12 DIAGNOSIS — H35372 Puckering of macula, left eye: Secondary | ICD-10-CM

## 2024-02-12 DIAGNOSIS — H4312 Vitreous hemorrhage, left eye: Secondary | ICD-10-CM

## 2024-02-12 DIAGNOSIS — Z961 Presence of intraocular lens: Secondary | ICD-10-CM

## 2024-02-12 DIAGNOSIS — H25813 Combined forms of age-related cataract, bilateral: Secondary | ICD-10-CM

## 2024-02-12 DIAGNOSIS — H33321 Round hole, right eye: Secondary | ICD-10-CM

## 2024-02-12 DIAGNOSIS — H3322 Serous retinal detachment, left eye: Secondary | ICD-10-CM | POA: Diagnosis not present

## 2024-02-12 DIAGNOSIS — H35411 Lattice degeneration of retina, right eye: Secondary | ICD-10-CM

## 2024-02-12 DIAGNOSIS — H25811 Combined forms of age-related cataract, right eye: Secondary | ICD-10-CM

## 2024-02-18 ENCOUNTER — Encounter (INDEPENDENT_AMBULATORY_CARE_PROVIDER_SITE_OTHER): Payer: Self-pay | Admitting: Ophthalmology

## 2024-03-11 ENCOUNTER — Ambulatory Visit (INDEPENDENT_AMBULATORY_CARE_PROVIDER_SITE_OTHER): Admitting: *Deleted

## 2024-03-11 DIAGNOSIS — Z23 Encounter for immunization: Secondary | ICD-10-CM | POA: Diagnosis not present

## 2024-08-08 ENCOUNTER — Encounter (INDEPENDENT_AMBULATORY_CARE_PROVIDER_SITE_OTHER): Admitting: Ophthalmology
